# Patient Record
Sex: Male | Born: 1965 | Race: White | Hispanic: No | Marital: Married | Smoking: Never smoker
Health system: Southern US, Community
[De-identification: ages and names within clinical notes are randomized; demographics above are authoritative.]

## PROBLEM LIST (undated history)

## (undated) DIAGNOSIS — K219 Gastro-esophageal reflux disease without esophagitis: Secondary | ICD-10-CM

## (undated) DIAGNOSIS — D229 Melanocytic nevi, unspecified: Secondary | ICD-10-CM

## (undated) DIAGNOSIS — N201 Calculus of ureter: Secondary | ICD-10-CM

## (undated) DIAGNOSIS — T7840XA Allergy, unspecified, initial encounter: Secondary | ICD-10-CM

## (undated) HISTORY — PX: WISDOM TOOTH EXTRACTION: SHX21

## (undated) HISTORY — PX: PATELLAR TENDON REPAIR: SHX737

## (undated) HISTORY — DX: Allergy, unspecified, initial encounter: T78.40XA

## (undated) HISTORY — PX: LITHOTRIPSY: SUR834

## (undated) HISTORY — DX: Gastro-esophageal reflux disease without esophagitis: K21.9

---

## 1988-11-22 DIAGNOSIS — D229 Melanocytic nevi, unspecified: Secondary | ICD-10-CM

## 1988-11-22 HISTORY — DX: Melanocytic nevi, unspecified: D22.9

## 2006-06-26 ENCOUNTER — Emergency Department: Payer: Self-pay | Admitting: Emergency Medicine

## 2006-06-29 ENCOUNTER — Ambulatory Visit: Payer: Self-pay | Admitting: Gerontology

## 2009-09-26 ENCOUNTER — Ambulatory Visit: Payer: Self-pay

## 2010-07-24 ENCOUNTER — Emergency Department: Payer: Self-pay | Admitting: Emergency Medicine

## 2011-02-19 ENCOUNTER — Ambulatory Visit: Payer: Self-pay | Admitting: Urology

## 2011-10-07 ENCOUNTER — Ambulatory Visit: Payer: Self-pay | Admitting: Unknown Physician Specialty

## 2012-02-15 ENCOUNTER — Ambulatory Visit: Payer: Self-pay | Admitting: Urology

## 2012-09-08 ENCOUNTER — Emergency Department: Payer: Self-pay | Admitting: Emergency Medicine

## 2012-09-08 LAB — BASIC METABOLIC PANEL
Anion Gap: 9 (ref 7–16)
Calcium, Total: 8.8 mg/dL (ref 8.5–10.1)
Co2: 28 mmol/L (ref 21–32)
EGFR (African American): >60
EGFR (Non-African Amer.): >60
Sodium: 143 mmol/L (ref 136–145)

## 2012-09-08 LAB — URINALYSIS, COMPLETE
Ketone: NEGATIVE
Leukocyte Esterase: NEGATIVE
Nitrite: NEGATIVE
Protein: NEGATIVE
RBC,UR: 52 /HPF (ref 0–5)
WBC UR: 1 /HPF (ref 0–5)

## 2012-09-08 LAB — CBC
MCH: 31.2 pg (ref 26.0–34.0)
MCHC: 34.8 g/dL (ref 32.0–36.0)
Platelet: 265 10*3/uL (ref 150–440)

## 2012-09-08 LAB — LIPASE, BLOOD: Lipase: 132 U/L (ref 73–393)

## 2012-09-18 ENCOUNTER — Emergency Department: Payer: Self-pay | Admitting: Emergency Medicine

## 2012-09-18 LAB — COMPREHENSIVE METABOLIC PANEL
Albumin: 3.9 g/dL (ref 3.4–5.0)
Alkaline Phosphatase: 72 U/L (ref 50–136)
Anion Gap: 8 (ref 7–16)
BUN: 21 mg/dL — ABNORMAL HIGH (ref 7–18)
Calcium, Total: 8.5 mg/dL (ref 8.5–10.1)
Chloride: 107 mmol/L (ref 98–107)
Creatinine: 1.56 mg/dL — ABNORMAL HIGH (ref 0.60–1.30)
Glucose: 94 mg/dL (ref 65–99)
Osmolality: 286 (ref 275–301)
Potassium: 4.2 mmol/L (ref 3.5–5.1)
Total Protein: 7.7 g/dL (ref 6.4–8.2)

## 2012-09-18 LAB — URINALYSIS, COMPLETE
Ketone: NEGATIVE
Ph: 5 (ref 4.5–8.0)
Protein: NEGATIVE
RBC,UR: 3 /HPF (ref 0–5)
Specific Gravity: 1.018 (ref 1.003–1.030)
Squamous Epithelial: <1

## 2012-09-18 LAB — CBC
HCT: 44 % (ref 40.0–52.0)
MCH: 30.9 pg (ref 26.0–34.0)
MCHC: 34.4 g/dL (ref 32.0–36.0)
MCV: 90 fL (ref 80–100)
Platelet: 235 10*3/uL (ref 150–440)
RDW: 12.3 % (ref 11.5–14.5)

## 2012-09-18 LAB — LIPASE, BLOOD: Lipase: 142 U/L (ref 73–393)

## 2012-09-20 DIAGNOSIS — N23 Unspecified renal colic: Secondary | ICD-10-CM | POA: Insufficient documentation

## 2013-06-18 ENCOUNTER — Ambulatory Visit: Payer: Self-pay | Admitting: Unknown Physician Specialty

## 2013-06-20 ENCOUNTER — Ambulatory Visit: Payer: Self-pay | Admitting: Orthopedic Surgery

## 2013-06-20 LAB — BASIC METABOLIC PANEL
Calcium, Total: 8.9 mg/dL (ref 8.5–10.1)
Chloride: 104 mmol/L (ref 98–107)
Creatinine: 1.19 mg/dL (ref 0.60–1.30)
EGFR (Non-African Amer.): >60
Glucose: 94 mg/dL (ref 65–99)
Osmolality: 279 (ref 275–301)
Sodium: 138 mmol/L (ref 136–145)

## 2013-06-20 LAB — PROTIME-INR
INR: 1
Prothrombin Time: 13.3 secs (ref 11.5–14.7)

## 2013-06-20 LAB — URINALYSIS, COMPLETE
Bilirubin,UR: NEGATIVE
Blood: NEGATIVE
Ketone: NEGATIVE
Leukocyte Esterase: NEGATIVE
Nitrite: NEGATIVE
Ph: 5 (ref 4.5–8.0)
RBC,UR: 1 /HPF (ref 0–5)
Specific Gravity: 1.021 (ref 1.003–1.030)
Squamous Epithelial: NONE SEEN

## 2013-06-20 LAB — CBC
HCT: 41.2 % (ref 40.0–52.0)
MCH: 30.8 pg (ref 26.0–34.0)
MCHC: 35.3 g/dL (ref 32.0–36.0)
MCV: 87 fL (ref 80–100)
Platelet: 248 10*3/uL (ref 150–440)
RBC: 4.72 10*6/uL (ref 4.40–5.90)
RDW: 12.2 % (ref 11.5–14.5)
WBC: 5.2 10*3/uL (ref 3.8–10.6)

## 2013-06-21 ENCOUNTER — Ambulatory Visit: Payer: Self-pay | Admitting: Orthopedic Surgery

## 2013-09-01 ENCOUNTER — Emergency Department: Payer: Self-pay | Admitting: Emergency Medicine

## 2013-09-01 LAB — COMPREHENSIVE METABOLIC PANEL
Anion Gap: 6 — ABNORMAL LOW (ref 7–16)
Chloride: 105 mmol/L (ref 98–107)
Co2: 26 mmol/L (ref 21–32)
Creatinine: 1.16 mg/dL (ref 0.60–1.30)
EGFR (African American): >60
Glucose: 123 mg/dL — ABNORMAL HIGH (ref 65–99)
Osmolality: 275 (ref 275–301)
SGOT(AST): 29 U/L (ref 15–37)
Sodium: 137 mmol/L (ref 136–145)
Total Protein: 7.3 g/dL (ref 6.4–8.2)

## 2013-09-01 LAB — CBC
HCT: 42.5 % (ref 40.0–52.0)
MCH: 30.5 pg (ref 26.0–34.0)
MCV: 87 fL (ref 80–100)
Platelet: 250 10*3/uL (ref 150–440)
RBC: 4.88 10*6/uL (ref 4.40–5.90)
RDW: 12.5 % (ref 11.5–14.5)
WBC: 10.6 10*3/uL (ref 3.8–10.6)

## 2013-09-01 LAB — URINALYSIS, COMPLETE
Bacteria: NONE SEEN
Ketone: NEGATIVE
Leukocyte Esterase: NEGATIVE
Nitrite: NEGATIVE
Protein: NEGATIVE
Specific Gravity: 1.011 (ref 1.003–1.030)
WBC UR: 1 /HPF (ref 0–5)

## 2013-11-22 HISTORY — PX: KNEE ARTHROSCOPY: SUR90

## 2014-01-18 ENCOUNTER — Emergency Department: Payer: Self-pay | Admitting: Emergency Medicine

## 2014-01-18 LAB — CBC
HCT: 43.8 % (ref 40.0–52.0)
HGB: 14.8 g/dL (ref 13.0–18.0)
MCH: 30.2 pg (ref 26.0–34.0)
MCHC: 33.8 g/dL (ref 32.0–36.0)
MCV: 89 fL (ref 80–100)
Platelet: 265 10*3/uL (ref 150–440)
RBC: 4.91 10*6/uL (ref 4.40–5.90)
RDW: 12.5 % (ref 11.5–14.5)
WBC: 6.7 10*3/uL (ref 3.8–10.6)

## 2014-01-18 LAB — URINALYSIS, COMPLETE
BACTERIA: NONE SEEN
Bilirubin,UR: NEGATIVE
Blood: NEGATIVE
Glucose,UR: NEGATIVE mg/dL (ref 0–75)
Ketone: NEGATIVE
Leukocyte Esterase: NEGATIVE
Nitrite: NEGATIVE
PH: 5 (ref 4.5–8.0)
PROTEIN: NEGATIVE
RBC,UR: 3 /HPF (ref 0–5)
SPECIFIC GRAVITY: 1.023 (ref 1.003–1.030)
WBC UR: <1 /HPF (ref 0–5)

## 2014-01-18 LAB — COMPREHENSIVE METABOLIC PANEL
ALK PHOS: 69 U/L
ALT: 41 U/L (ref 12–78)
ANION GAP: 10 (ref 7–16)
Albumin: 3.9 g/dL (ref 3.4–5.0)
BUN: 17 mg/dL (ref 7–18)
Bilirubin,Total: 0.6 mg/dL (ref 0.2–1.0)
CHLORIDE: 103 mmol/L (ref 98–107)
Calcium, Total: 8.9 mg/dL (ref 8.5–10.1)
Co2: 26 mmol/L (ref 21–32)
Creatinine: 1.31 mg/dL — ABNORMAL HIGH (ref 0.60–1.30)
GLUCOSE: 110 mg/dL — AB (ref 65–99)
Osmolality: 280 (ref 275–301)
POTASSIUM: 3.8 mmol/L (ref 3.5–5.1)
SGOT(AST): 17 U/L (ref 15–37)
SODIUM: 139 mmol/L (ref 136–145)
TOTAL PROTEIN: 7.6 g/dL (ref 6.4–8.2)

## 2014-01-20 LAB — URINE CULTURE

## 2014-12-31 IMAGING — US US RENAL KIDNEY
1 series · 14 of 25 positions shown · non-contrast
Comparison: CT abdomen pelvis dated 09/08/2012

CLINICAL DATA: Left lower quadrant pain, history of stones

EXAM:
RENAL/URINARY TRACT ULTRASOUND COMPLETE

[Series 1: us renal kidney · 0.23mm/px · 14 of 32 slices shown]
[im 1/32]
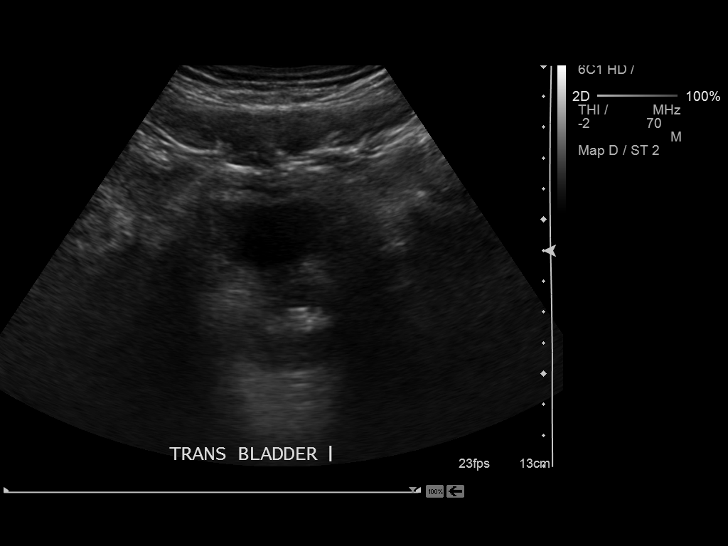
[im 3/32]
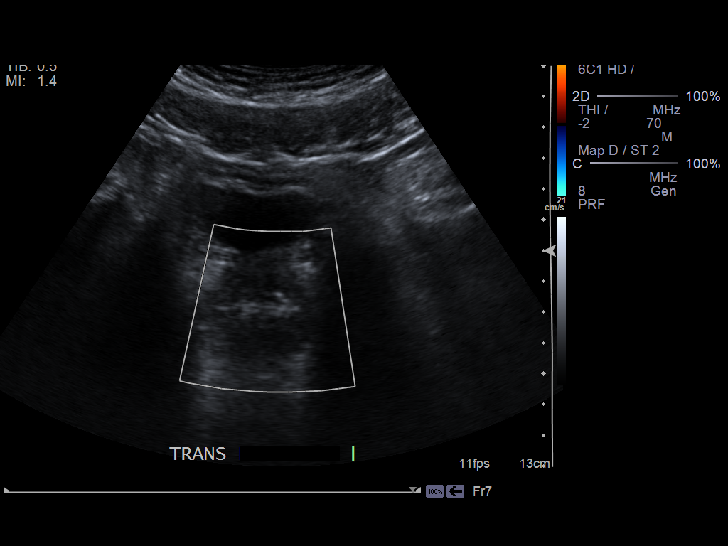
[im 6/32]
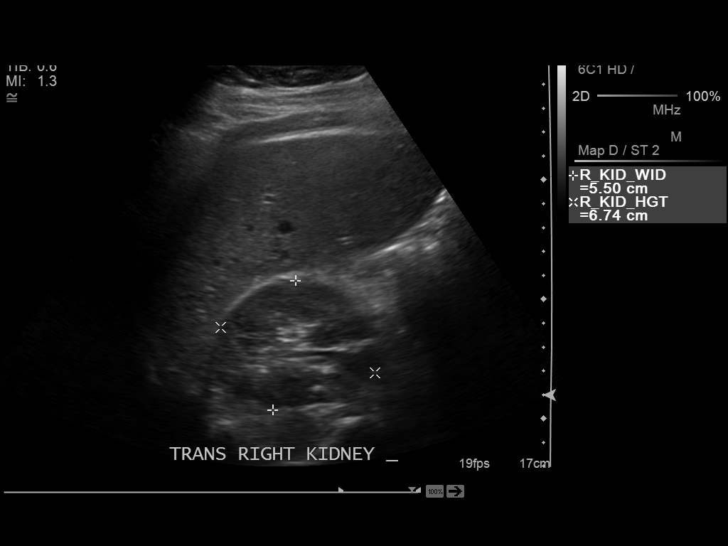
[im 8/32]
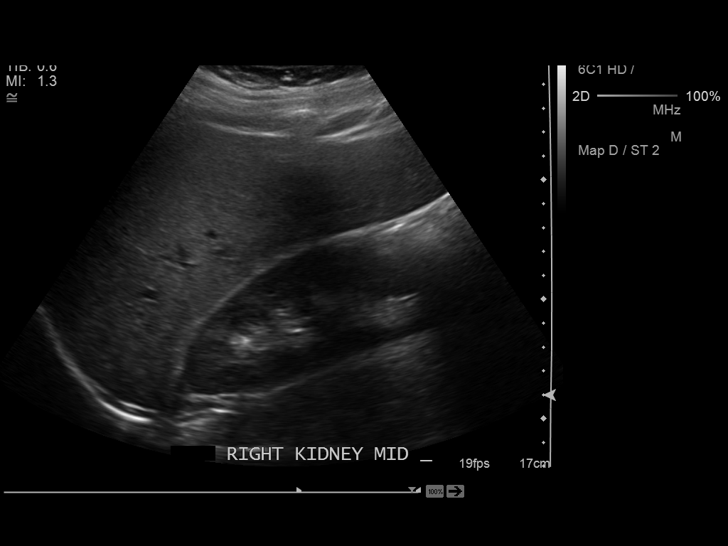
[im 11/32]
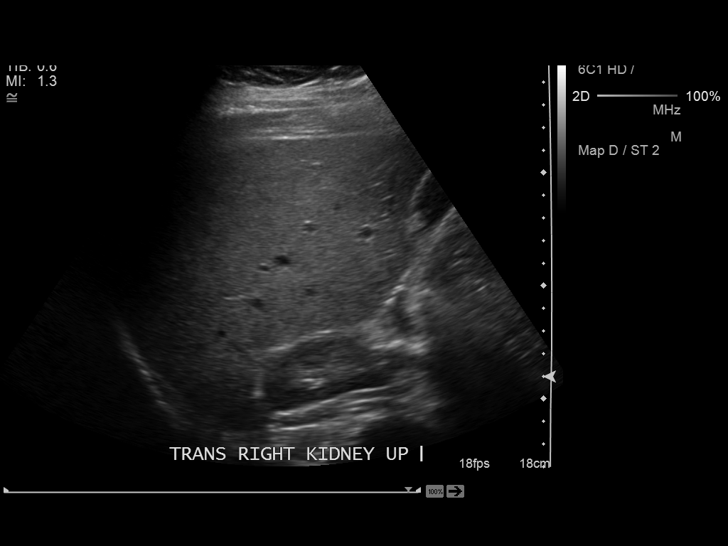
[im 12/32]
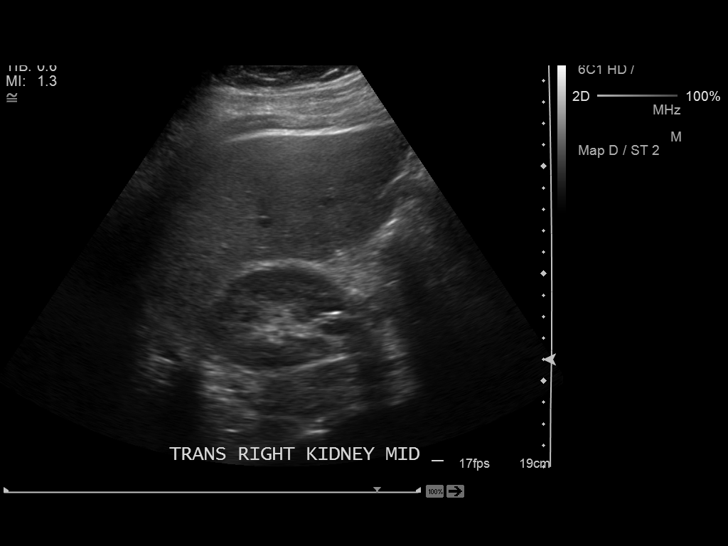
[im 15/32]
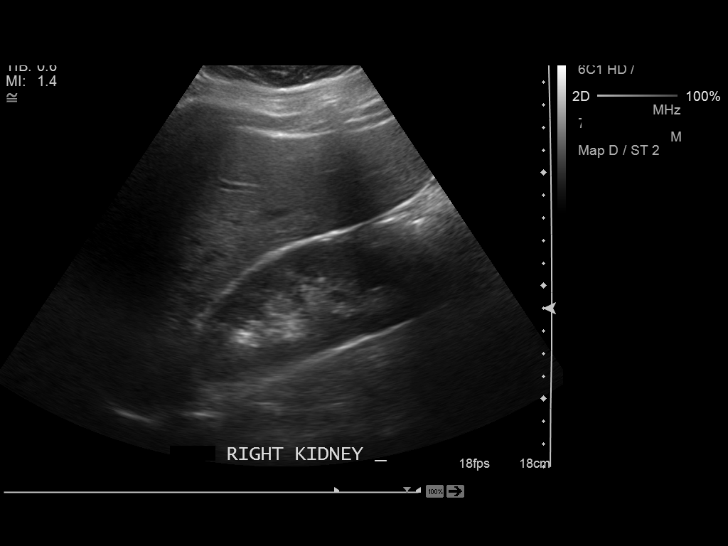
[im 17/32]
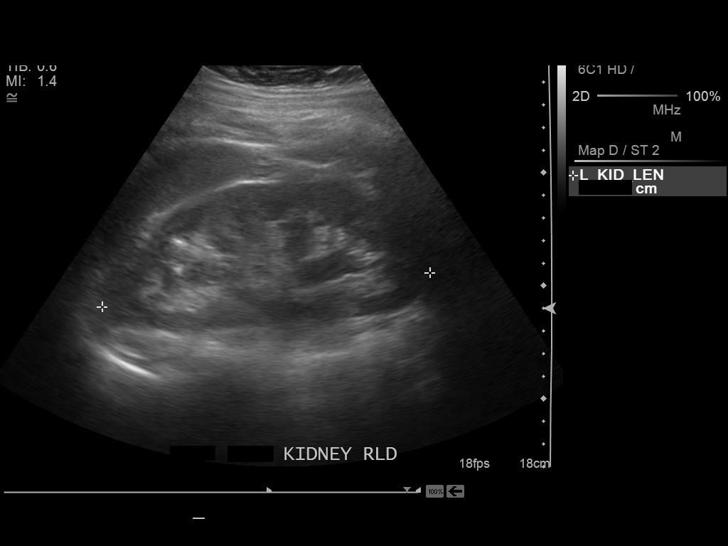
[im 20/32]
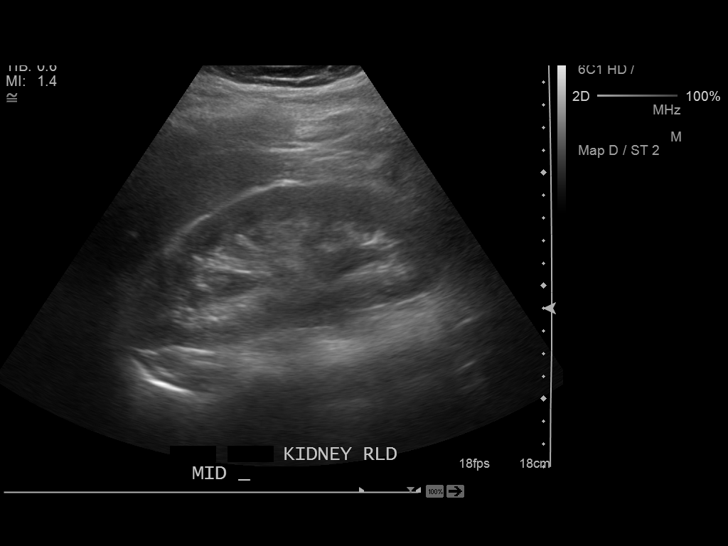
[im 21/32]
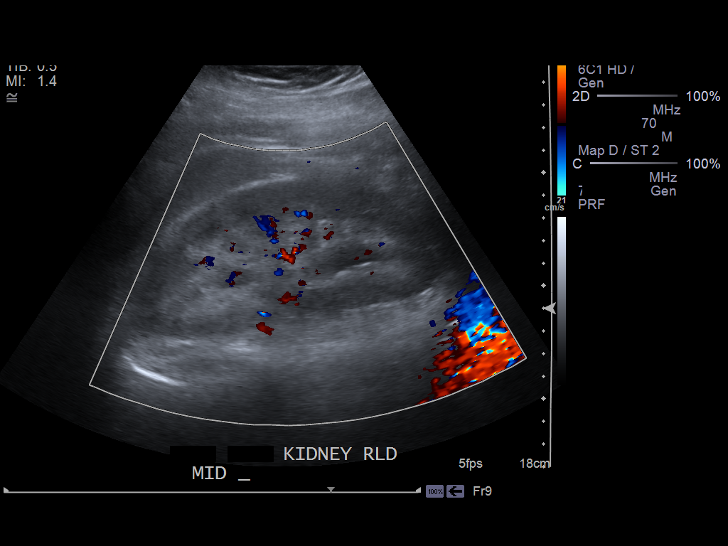
[im 24/32]
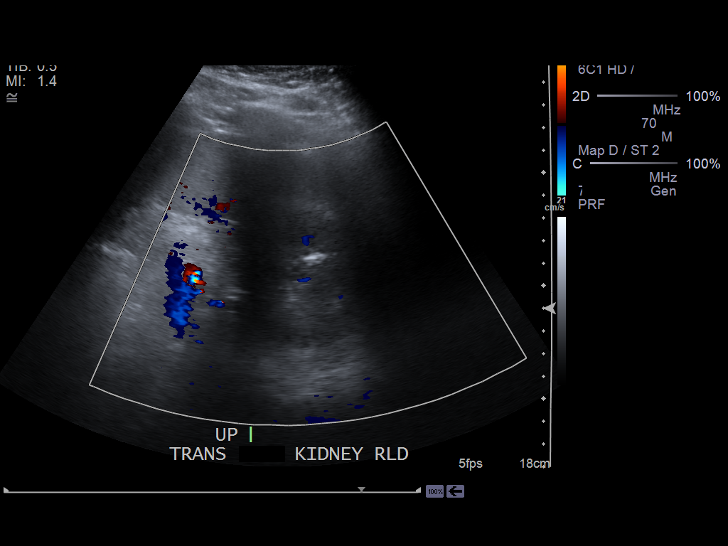
[im 26/32]
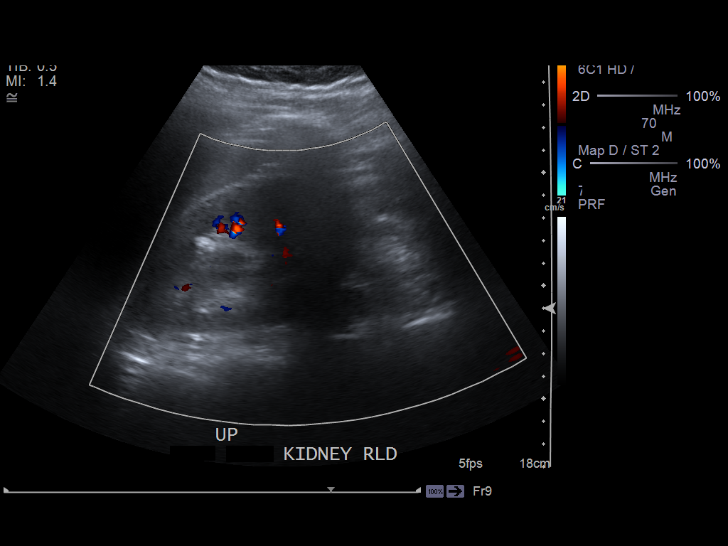
[im 29/32]
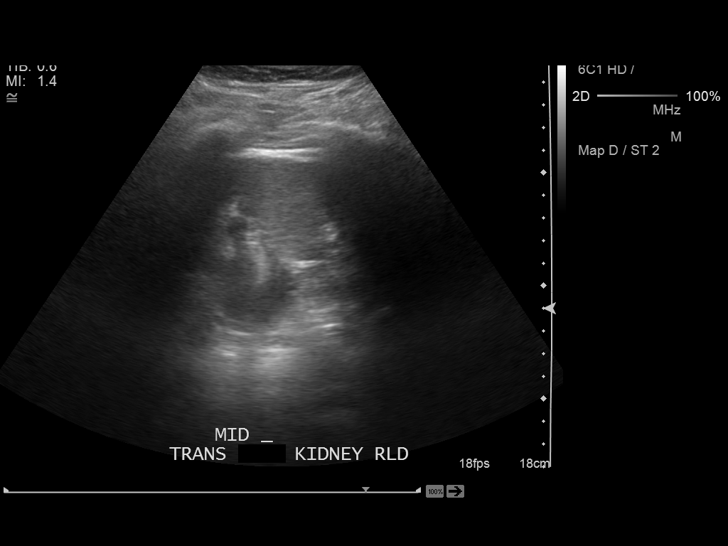
[im 32/32]
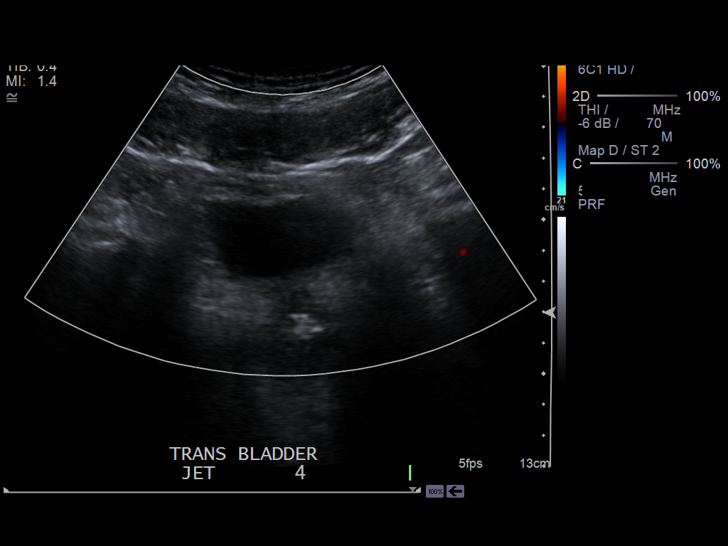

[14 of 25 positions shown; findings below may reference images not displayed]

FINDINGS: Right Kidney:

Length: 12.5 cm.  No mass or hydronephrosis.

Left Kidney:

Length: 14.6 cm. 10 mm probable left upper pole renal calculus. Mild
to moderate left hydronephrosis.

Bladder:

Underdistended.  Bilateral bladder jets were not visualized.
IMPRESSION: Mild to moderate left hydronephrosis. While an obstructing left
ureteral calculus is suspected, it is not visualized by ultrasound.

10 mm probable left upper pole renal calculus.

## 2014-12-31 IMAGING — CT CT STONE STUDY
3 of 4 series · 5 of 16 positions shown, 6 images · non-contrast
Comparison: 09/08/2012

CLINICAL DATA: Pelvic pain

EXAM:
CT ABDOMEN AND PELVIS WITHOUT CONTRAST
TECHNIQUE: Multidetector CT imaging of the abdomen and pelvis was performed
following the standard protocol without IV contrast.

[Series 4: lung · axial · 0.89mm/px · z∈[-588,-588]mm · 1 of 30 slices shown, 2 images]
[im 1/30  soft-tissue]
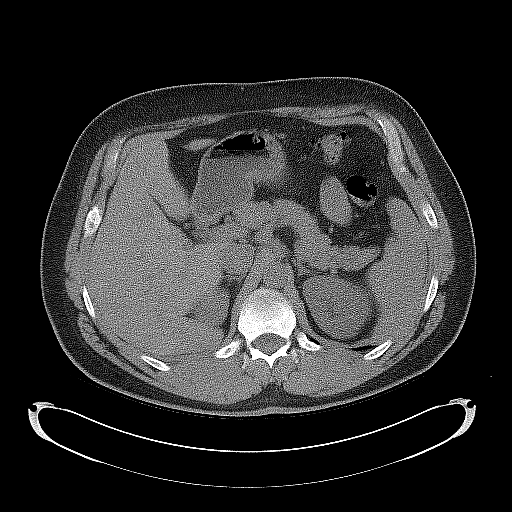
[im 1/30  bone]
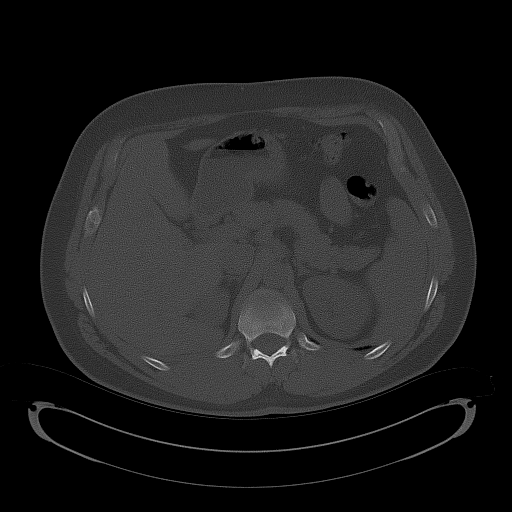

[Series 5: coronal · coronal · 0.94mm/px · 3 of 145 slices shown]
[im 37/145  soft-tissue]
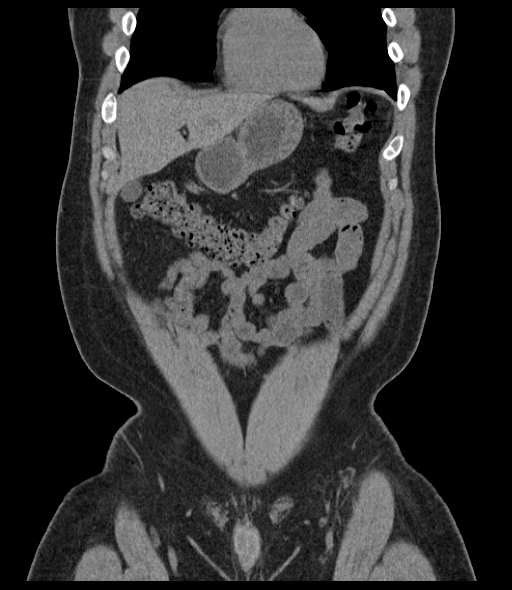
[im 73/145  soft-tissue]
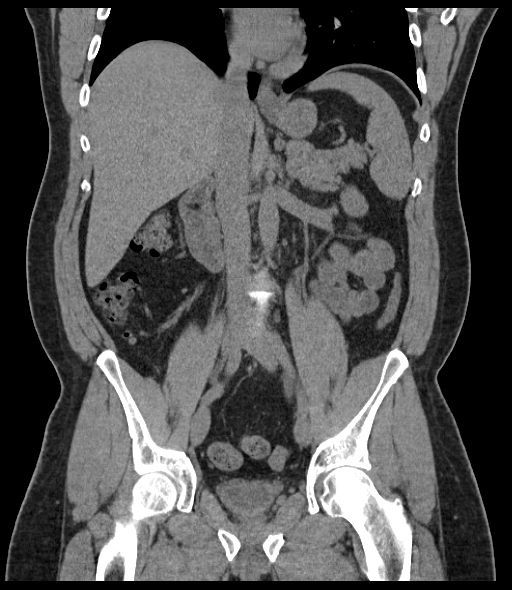
[im 109/145  soft-tissue]
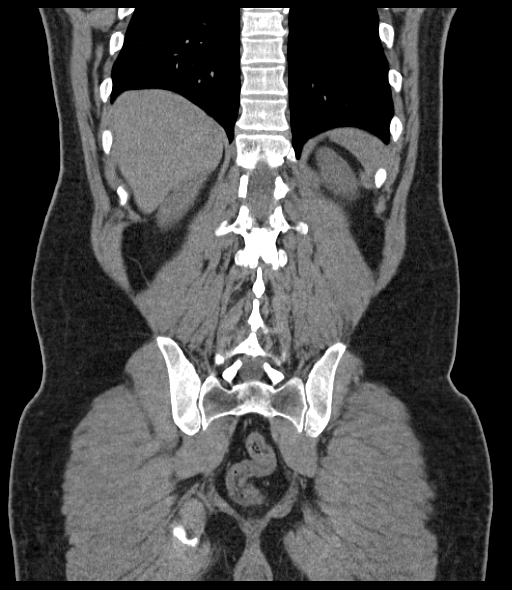

[Series 6: sagittal · sagittal · 0.66mm/px · 1 of 209 slices shown]
[im 105/209  soft-tissue]
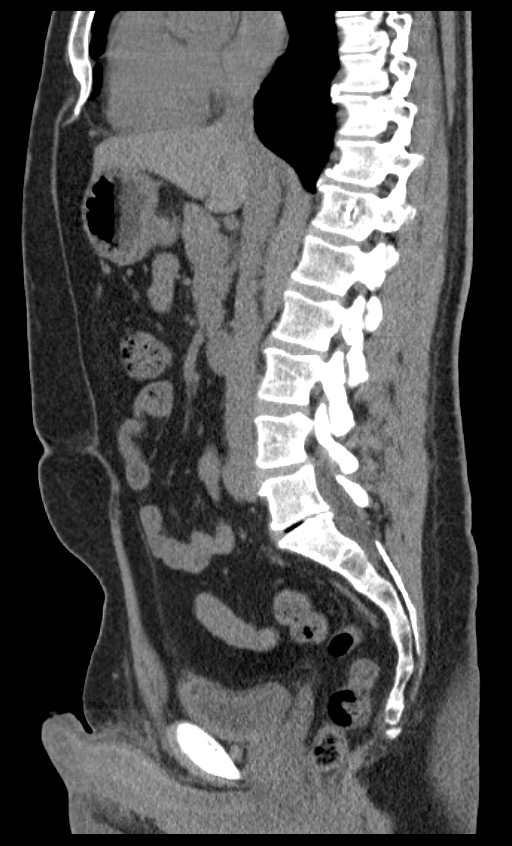

[5 of 16 positions shown; findings below may reference images not displayed]

FINDINGS: Visualized lung bases clear. Unremarkable liver, nondilated
gallbladder, spleen, adrenal glands, pancreas, abdominal aorta.
Unenhanced CT was performed per clinician order. Lack of IV contrast
limits sensitivity and specificity, especially for evaluation of
abdominal/pelvic solid viscera.

There is bilateral nephrolithiasis, largest stone or cluster on the
left in the upper pole 8 mm, on the right in the upper pole 5 mm.
There is left hydronephrosis and ureterectasis down to the level of
an 8 mm calculus, just proximal to the ureteral orifice. The urinary
bladder is nondistended. Bilateral small pelvic phleboliths. No
ascites. No free air. Stomach, small bowel, and colon are
nondilated. Normal appendix. A few scattered sigmoid diverticula
without adjacent inflammatory/edematous change. No adenopathy
evident. Degenerative disc disease L5-S1 .
IMPRESSION: 1. Obstructing 8 mm distal left ureteral calculus.
2. Bilateral nephrolithiasis.

## 2015-03-14 NOTE — Op Note (Signed)
PATIENT NAME:  Benjamin Wong, Benjamin Wong MR#:  [id number] DATE OF BIRTH:  Jul 03, 1966  DATE OF PROCEDURE:  06/21/2013  PREOPERATIVE DIAGNOSIS: Left knee patellar tendon rupture.   POSTOPERATIVE DIAGNOSIS: Left knee patellar tendon rupture.   PROCEDURE PERFORMED: Left knee patellar tendon repair.   SURGEON: Dawayne Patricia, MD   ASSISTANT: None.   ANESTHESIA: General.   TOURNIQUET TIME: 109 minutes at 300 mmHg.   ESTIMATED BLOOD LOSS: Less than 50 mL.  ESTIMATED FLUID: 1100 mL.  FINDINGS: Complete rupture of patellar tendon at the inferior pole of the patella.   DISPOSITION: The patient will be placed in a brace locked in extension. He will be given aspirin for prophylaxis. He will be given pain medication. He does have a Blake drain with a bulb. Drain will be discontinued by the patient's wife, who is a Marine scientist, on postoperative day 2.   INDICATIONS FOR PROCEDURE: Benjamin Wong is a 49 year old gentleman who works as a Engineer, structural in [place]. [place] beach [place] 1 [place] and sustained the aforementioned injury to his knee while playing volleyball. He [place] and was placed in a knee immobilizer. He presented for followup in our office on Monday, had an MRI the same day, and presented to my office on Wednesday. He was found to have a complete rupture of the patellar tendon. All the risks and benefits of surgical repair were discussed with the patient and his wife. The most common risks include stiffness of the knee postoperatively and anterior knee pain. The patient demonstrates good understanding and insight. He elected to undergo surgery.   DESCRIPTION OF PROCEDURE: Benjamin Wong was identified in the preoperative holding area. Left lower extremity was marked as the operative site. He was brought into the Operating Room and placed on the table in supine position. General anesthesia was administered.   Tourniquet was  applied to the left lower extremity. The left lower extremity was prepared and draped in the usual sterile fashion.  Preoperative timeout was performed identifying the patient, procedure, operative extremity, confirming preparation of skin, confirming consent form, confirming imaging studies and confirming administration of preoperative antibiotics.   The leg was elevated and tourniquet was inflated. The proximal pole of the patella was palpated and tibial tubercle was palpated. A longitudinal midline incision was made. Dissection was carried down through the skin and subcutaneous tissue. Significant clotted hematoma was expressed from the site of injury. The patella was very high-riding. The patella was readily drawn back down to the superior level of the notch with adequate muscle relaxation. The inferior pole of the patella was cleared of soft tissue, and a nice trough of bone was cleared. The tourniquet was deflated to ensure that this trough of bone did, in fact, bleed. When bleeding was confirmed, the tourniquet was reinflated.   The residual tendon was identified. Overlying retinaculum was preserved and remained intact through the procedure. The tendon edge was sharply debrided. Two #5 FiberWire sutures were passed using a running Krackow stitch down the lateral aspect of the tendon and back through the mid portion of the tendon. In a similar fashion, the second stitch was placed from the medial aspect of the tendon to the mid portion of the tendon. A 2.5 drill bit was used to make 3 holes in the patella. The middle hole was drilled first. A Hughston suture passer was passed through the middle hole, and the middle 2 sutures were retrieved  on the proximal aspect of the patella. In a similar fashion, the suture passer was passed through both the lateral and the medial holes, and the respective single suture limb was passed through. The 2 lateral sutures were tied with the knee held in slight flexion. A  knot was tightened down. In a similar fashion, medial 2 suture limbs were tied. At this time, the knee was taken through a range of motion. Adequate tension was maintained with the knee in full extension. The knee was readily able to be taken to 60 degrees of flexion with no stress placed across the repair.   Attention was now turned to the tibial tubercle. Using a 1.8 mm drill bit, a hole was drilled transversely across the tibial tubercle. A #2 Vicryl suture was passed through this hole. A Kelly clamp was used at the proximal aspect of the patella to reach across the patella behind the quadriceps tendon. When the clamp was passed, 1 limb of the suture was passed around the proximal pole of the patella. The #2 Vicryl suture was then tied at the proximal/lateral aspect of the patella. This was added absorbable fixation. At this time, the knee was copiously irrigated. Suture of 0 Vicryl was used to close the torn portion of the retinaculum. The medial portion of the joint was open from injury. Undersurface of the patella was palpated during drilling and maintained intact. The medial retinaculum was closed, sealing off the joint. A small Blake drain was placed outside of the patellar retinaculum. Deep tissue was closed using 0 Vicryl suture. The superficial tissue was closed using 2-0 Vicryl suture. Skin was closed using 1 suture. Sterile dressings were applied. The patient was placed in a hinged knee brace locked in extension. TENS leads and a Polar Care unit was applied. The patient will be nonweightbearing. He will be maintained in extension until he begins physical therapy. When he begins physical therapy, they will start range of motion at 0 to 40 degrees of flexion and progress accordingly.    ____________________________ Dawayne Patricia, MD sr:cb D: 06/21/2013 15:56:21 ET T: 06/21/2013 20:35:37 ET JOB#: [id number]  cc: Dawayne Patricia, MD, <Dictator> Dawayne Patricia MD ELECTRONICALLY SIGNED  07/17/2013 10:21

## 2017-03-02 DIAGNOSIS — L729 Follicular cyst of the skin and subcutaneous tissue, unspecified: Secondary | ICD-10-CM | POA: Diagnosis not present

## 2017-03-02 DIAGNOSIS — G8929 Other chronic pain: Secondary | ICD-10-CM | POA: Insufficient documentation

## 2017-03-02 DIAGNOSIS — E7800 Pure hypercholesterolemia, unspecified: Secondary | ICD-10-CM | POA: Diagnosis not present

## 2017-03-02 DIAGNOSIS — E669 Obesity, unspecified: Secondary | ICD-10-CM | POA: Insufficient documentation

## 2017-03-03 DIAGNOSIS — E7800 Pure hypercholesterolemia, unspecified: Secondary | ICD-10-CM | POA: Diagnosis not present

## 2017-03-03 DIAGNOSIS — E781 Pure hyperglyceridemia: Secondary | ICD-10-CM | POA: Insufficient documentation

## 2017-06-28 DIAGNOSIS — E781 Pure hyperglyceridemia: Secondary | ICD-10-CM | POA: Diagnosis not present

## 2017-07-05 DIAGNOSIS — R1310 Dysphagia, unspecified: Secondary | ICD-10-CM | POA: Diagnosis not present

## 2017-07-05 DIAGNOSIS — Z0000 Encounter for general adult medical examination without abnormal findings: Secondary | ICD-10-CM | POA: Diagnosis not present

## 2017-07-21 DIAGNOSIS — D229 Melanocytic nevi, unspecified: Secondary | ICD-10-CM | POA: Diagnosis not present

## 2017-07-21 DIAGNOSIS — L508 Other urticaria: Secondary | ICD-10-CM | POA: Diagnosis not present

## 2017-07-21 DIAGNOSIS — L509 Urticaria, unspecified: Secondary | ICD-10-CM | POA: Diagnosis not present

## 2017-07-21 DIAGNOSIS — L821 Other seborrheic keratosis: Secondary | ICD-10-CM | POA: Diagnosis not present

## 2017-07-21 DIAGNOSIS — Z79899 Other long term (current) drug therapy: Secondary | ICD-10-CM | POA: Diagnosis not present

## 2017-09-01 DIAGNOSIS — R21 Rash and other nonspecific skin eruption: Secondary | ICD-10-CM | POA: Diagnosis not present

## 2017-09-01 DIAGNOSIS — B372 Candidiasis of skin and nail: Secondary | ICD-10-CM | POA: Diagnosis not present

## 2017-09-07 DIAGNOSIS — T7840XA Allergy, unspecified, initial encounter: Secondary | ICD-10-CM | POA: Diagnosis not present

## 2017-09-07 DIAGNOSIS — Z79899 Other long term (current) drug therapy: Secondary | ICD-10-CM | POA: Diagnosis not present

## 2017-09-22 HISTORY — PX: COLONOSCOPY WITH ESOPHAGOGASTRODUODENOSCOPY (EGD): SHX5779

## 2017-09-29 DIAGNOSIS — L739 Follicular disorder, unspecified: Secondary | ICD-10-CM | POA: Diagnosis not present

## 2017-09-29 DIAGNOSIS — L509 Urticaria, unspecified: Secondary | ICD-10-CM | POA: Diagnosis not present

## 2017-09-29 DIAGNOSIS — R21 Rash and other nonspecific skin eruption: Secondary | ICD-10-CM | POA: Diagnosis not present

## 2017-09-30 DIAGNOSIS — Z8371 Family history of colonic polyps: Secondary | ICD-10-CM | POA: Diagnosis not present

## 2017-09-30 DIAGNOSIS — R1310 Dysphagia, unspecified: Secondary | ICD-10-CM | POA: Diagnosis not present

## 2017-10-06 DIAGNOSIS — K210 Gastro-esophageal reflux disease with esophagitis: Secondary | ICD-10-CM | POA: Diagnosis not present

## 2017-10-06 DIAGNOSIS — K2960 Other gastritis without bleeding: Secondary | ICD-10-CM | POA: Diagnosis not present

## 2017-10-06 DIAGNOSIS — Z8371 Family history of colonic polyps: Secondary | ICD-10-CM | POA: Diagnosis not present

## 2017-10-06 DIAGNOSIS — Z1211 Encounter for screening for malignant neoplasm of colon: Secondary | ICD-10-CM | POA: Diagnosis not present

## 2017-10-06 DIAGNOSIS — R1310 Dysphagia, unspecified: Secondary | ICD-10-CM | POA: Diagnosis not present

## 2017-10-06 DIAGNOSIS — K2950 Unspecified chronic gastritis without bleeding: Secondary | ICD-10-CM | POA: Diagnosis not present

## 2017-10-06 DIAGNOSIS — K640 First degree hemorrhoids: Secondary | ICD-10-CM | POA: Diagnosis not present

## 2017-10-06 DIAGNOSIS — K2990 Gastroduodenitis, unspecified, without bleeding: Secondary | ICD-10-CM | POA: Diagnosis not present

## 2017-10-27 DIAGNOSIS — R21 Rash and other nonspecific skin eruption: Secondary | ICD-10-CM | POA: Diagnosis not present

## 2017-10-27 DIAGNOSIS — L739 Follicular disorder, unspecified: Secondary | ICD-10-CM | POA: Diagnosis not present

## 2018-01-20 DIAGNOSIS — N201 Calculus of ureter: Secondary | ICD-10-CM

## 2018-01-20 HISTORY — DX: Calculus of ureter: N20.1

## 2018-01-23 ENCOUNTER — Emergency Department: Payer: 59

## 2018-01-23 ENCOUNTER — Emergency Department
Admission: EM | Admit: 2018-01-23 | Discharge: 2018-01-23 | Disposition: A | Discharge status: Home / Self Care | Payer: 59 | Attending: Emergency Medicine | Admitting: Emergency Medicine

## 2018-01-23 ENCOUNTER — Other Ambulatory Visit: Payer: Self-pay

## 2018-01-23 DIAGNOSIS — N23 Unspecified renal colic: Secondary | ICD-10-CM | POA: Insufficient documentation

## 2018-01-23 DIAGNOSIS — N200 Calculus of kidney: Secondary | ICD-10-CM | POA: Diagnosis not present

## 2018-01-23 DIAGNOSIS — R1032 Left lower quadrant pain: Secondary | ICD-10-CM | POA: Diagnosis not present

## 2018-01-23 DIAGNOSIS — R110 Nausea: Secondary | ICD-10-CM | POA: Diagnosis not present

## 2018-01-23 LAB — URINALYSIS, COMPLETE (UACMP) WITH MICROSCOPIC
BACTERIA UA: NONE SEEN
Bilirubin Urine: NEGATIVE
GLUCOSE, UA: NEGATIVE mg/dL
Ketones, ur: NEGATIVE mg/dL
Leukocytes, UA: NEGATIVE
NITRITE: NEGATIVE
PROTEIN: NEGATIVE mg/dL
Specific Gravity, Urine: 1.017 (ref 1.005–1.030)
Squamous Epithelial / LPF: NONE SEEN
WBC UA: NONE SEEN WBC/hpf (ref 0–5)
pH: 5 (ref 5.0–8.0)

## 2018-01-23 LAB — COMPREHENSIVE METABOLIC PANEL
ALBUMIN: 4.4 g/dL (ref 3.5–5.0)
ALK PHOS: 55 U/L (ref 38–126)
ALT: 25 U/L (ref 17–63)
AST: 20 U/L (ref 15–41)
Anion gap: 10 (ref 5–15)
BUN: 26 mg/dL — ABNORMAL HIGH (ref 6–20)
CALCIUM: 9.1 mg/dL (ref 8.9–10.3)
CO2: 23 mmol/L (ref 22–32)
Chloride: 106 mmol/L (ref 101–111)
Creatinine, Ser: 1.22 mg/dL (ref 0.61–1.24)
GFR calc non Af Amer: >60 mL/min (ref >60)
GLUCOSE: 123 mg/dL — AB (ref 65–99)
POTASSIUM: 4.1 mmol/L (ref 3.5–5.1)
SODIUM: 139 mmol/L (ref 135–145)
TOTAL PROTEIN: 7.4 g/dL (ref 6.5–8.1)
Total Bilirubin: 0.9 mg/dL (ref 0.3–1.2)

## 2018-01-23 LAB — CBC
HEMATOCRIT: 46.4 % (ref 40.0–52.0)
HEMOGLOBIN: 15.6 g/dL (ref 13.0–18.0)
MCH: 30.3 pg (ref 26.0–34.0)
MCHC: 33.7 g/dL (ref 32.0–36.0)
MCV: 89.9 fL (ref 80.0–100.0)
Platelets: 236 10*3/uL (ref 150–440)
RBC: 5.16 MIL/uL (ref 4.40–5.90)
RDW: 12.3 % (ref 11.5–14.5)
WBC: 7.4 10*3/uL (ref 3.8–10.6)

## 2018-01-23 MED ORDER — ONDANSETRON 4 MG PO TBDP: 4.0000 mg | ORAL_TABLET | Freq: Three times a day (TID) | ORAL | 0 refills | Status: DC | PRN

## 2018-01-23 MED ORDER — TAMSULOSIN HCL 0.4 MG PO CAPS: 0.4000 mg | ORAL_CAPSULE | Freq: Every day | ORAL | 0 refills | Status: DC

## 2018-01-23 MED ORDER — KETOROLAC TROMETHAMINE 30 MG/ML IJ SOLN
15.0000 mg | Freq: Once | INTRAMUSCULAR | Status: AC
  Administered 2018-01-23: 15 mg via INTRAVENOUS
  Filled 2018-01-23: qty 1

## 2018-01-23 MED ORDER — OXYCODONE-ACETAMINOPHEN 7.5-325 MG PO TABS
1.0000 | ORAL_TABLET | Freq: Three times a day (TID) | ORAL | 0 refills | Status: AC | PRN
Start: 2018-01-23 — End: 2019-01-23

## 2018-01-23 MED ORDER — HYDROMORPHONE HCL 1 MG/ML IJ SOLN: 0.5000 mg | Freq: Once | INTRAMUSCULAR | Status: DC

## 2018-01-23 MED ORDER — MORPHINE SULFATE (PF) 4 MG/ML IV SOLN
4.0000 mg | Freq: Once | INTRAVENOUS | Status: AC
  Administered 2018-01-23: 4 mg via INTRAVENOUS
  Filled 2018-01-23: qty 1

## 2018-01-23 MED ORDER — ONDANSETRON HCL 4 MG/2ML IJ SOLN
4.0000 mg | Freq: Once | INTRAMUSCULAR | Status: AC
  Administered 2018-01-23: 4 mg via INTRAVENOUS
  Filled 2018-01-23: qty 2

## 2018-01-23 MED ORDER — ONDANSETRON HCL 4 MG/2ML IJ SOLN
4.0000 mg | Freq: Once | INTRAMUSCULAR | Status: AC | PRN
  Administered 2018-01-23: 4 mg via INTRAVENOUS
  Filled 2018-01-23: qty 2

## 2018-01-23 NOTE — ED Triage Notes (Signed)
Pt states onset of llq pain with nausea that woke him from sleep at 0130. Pt denies known hematuria. Pt states history of renal calculi "that felt this way". Pt denies flank pain.

## 2018-01-23 NOTE — ED Provider Notes (Signed)
[facility] Emergency Department Provider Note       Time seen: ----------------------------------------- 7:17 AM on 01/23/2018 -----------------------------------------   I have reviewed the triage vital signs and the nursing notes.  HISTORY   Chief Complaint Abdominal Pain    HPI Benjamin Wong is a 52 y.o. male with a history of kidney stones who presents to the ED for left lower quadrant pain with nausea that woke him from sleep at 130 this morning.  Patient denies any hematuria.  He has a history of kidney stones that feel this way.  He has had to have lithotripsy in the distant past.  Pain is only in the left lower quadrant, 8 out of 10. No past medical history on file.  There are no active problems to display for this patient.   Allergies Patient has no known allergies.  Social History Social History   Tobacco Use  . Smoking status: Not on file  Substance Use Topics  . Alcohol use: Not on file  . Drug use: Not on file    Review of Systems Constitutional: Negative for fever. Cardiovascular: Negative for chest pain. Respiratory: Negative for shortness of breath. Gastrointestinal: Positive for left lower quadrant pain, nausea Musculoskeletal: Negative for back pain. Skin: Negative for rash. Neurological: Negative for headaches, focal weakness or numbness.  All systems negative/normal/unremarkable except as stated in the HPI  ____________________________________________   PHYSICAL EXAM:  VITAL SIGNS: ED Triage Vitals [01/23/18 0546]  Enc Vitals Group     BP 138/82     Pulse Rate 63     Resp 16     Temp 98.1 F (36.7 C)     Temp Source Oral     SpO2 100 %     Weight 256 lb (116.1 kg)     Height 6\' 3"  (1.905 m)     Head Circumference      Peak Flow      Pain Score 6     Pain Loc      Pain Edu?      Excl. in [place]?    Constitutional: Alert and oriented.  Mild distress Eyes: Conjunctivae are normal. Normal extraocular  movements. ENT   Head: Normocephalic and atraumatic.   Nose: No congestion/rhinnorhea.   Mouth/Throat: Mucous membranes are moist.   Neck: No stridor. Cardiovascular: Normal rate, regular rhythm. No murmurs, rubs, or gallops. Respiratory: Normal respiratory effort without tachypnea nor retractions. Breath sounds are clear and equal bilaterally. No wheezes/rales/rhonchi. Gastrointestinal: Soft with left lower quadrant tenderness.  Normal bowel sounds. Musculoskeletal: Nontender with normal range of motion in extremities. No lower extremity tenderness nor edema. Neurologic:  Normal speech and language. No gross focal neurologic deficits are appreciated.  Skin:  Skin is warm, dry and intact. No rash noted. Psychiatric: Mood and affect are normal. Speech and behavior are normal.  ____________________________________________  ED COURSE:  As part of my medical decision making, I reviewed the following data within the [facility] History obtained from family if available, nursing notes, old chart and ekg, as well as notes from prior ED visits. Patient presented for left lower quadrant pain, we will assess with labs and imaging as indicated at this time.   Procedures ____________________________________________   LABS (pertinent positives/negatives)  Labs Reviewed  COMPREHENSIVE METABOLIC PANEL - Abnormal; Notable for the following components:      Result Value   Glucose, Bld 123 (*)    BUN 26 (*)    All other components  within normal limits  URINALYSIS, COMPLETE (UACMP) WITH MICROSCOPIC - Abnormal; Notable for the following components:   Color, Urine YELLOW (*)    APPearance HAZY (*)    Hgb urine dipstick LARGE (*)    All other components within normal limits  CBC    RADIOLOGY Images were viewed by me  CT renal protocol IMPRESSION: Mid left ureteral obstructing calculus with transverse dimension 4 x 5 mm spanning over 8 mm located 14.6 cm proximal to  left ureteral junction is causing moderate left-sided hydroureteronephrosis.  Multiple nonobstructing left renal calculi. Three right upper pole nonobstructing renal calculi.  Enlarged liver spanning over 23.5 cm.  Circumferential narrowing distal esophagus may be related to under distension or reflux. Limited for evaluating for mass.  No extraluminal bowel inflammatory process. Few scattered colonic diverticula.  Degenerative changes L4-5 and L5-S1. ____________________________________________  DIFFERENTIAL DIAGNOSIS   Renal colic, diverticulitis, pyelonephritis, UTI, musculoskeletal pain  FINAL ASSESSMENT AND PLAN  Renal colic   Plan: The patient had presented for left lower quadrant pain. Patient's labs are grossly unremarkable except for hematuria. Patient's imaging did reveal a proximal left ureteral stone.  It is unclear if he will be able to pass the stone or not.  He did improve in terms of pain with Toradol and morphine here.  He will be discharged with similar medications for home and outpatient urology follow-up.   Laurence Aly, MD   Note: This note was generated in part or whole with voice recognition software. Voice recognition is usually quite accurate but there are transcription errors that can and very often do occur. I apologize for any typographical errors that were not detected and corrected.     Earleen Newport, MD 01/23/18 [phone]

## 2018-01-23 NOTE — ED Notes (Signed)
Pt states he would like to hold pain medication now, but would like something for nausea.

## 2018-01-23 NOTE — ED Notes (Signed)
Patient transported to CT 

## 2018-01-23 NOTE — ED Notes (Signed)
Patient reports woke this morning around 1:30 am with left lower quad pain that was similar to previous kidney stone pain.

## 2018-01-24 ENCOUNTER — Encounter: Payer: Self-pay | Admitting: Urology

## 2018-01-24 ENCOUNTER — Encounter: Payer: Self-pay | Admitting: *Deleted

## 2018-01-24 ENCOUNTER — Other Ambulatory Visit: Payer: Self-pay | Admitting: Radiology

## 2018-01-24 ENCOUNTER — Ambulatory Visit (INDEPENDENT_AMBULATORY_CARE_PROVIDER_SITE_OTHER): Payer: 59 | Admitting: Urology

## 2018-01-24 VITALS — BP 139/87 | HR 104 | Ht 75.0 in | Wt 258.3 lb

## 2018-01-24 DIAGNOSIS — N23 Unspecified renal colic: Secondary | ICD-10-CM | POA: Diagnosis not present

## 2018-01-24 DIAGNOSIS — N201 Calculus of ureter: Secondary | ICD-10-CM

## 2018-01-24 DIAGNOSIS — N200 Calculus of kidney: Secondary | ICD-10-CM

## 2018-01-24 LAB — BLADDER SCAN AMB NON-IMAGING: SCAN RESULT: 56

## 2018-01-24 LAB — URINALYSIS, COMPLETE
BILIRUBIN UA: NEGATIVE
GLUCOSE, UA: NEGATIVE
Ketones, UA: NEGATIVE
LEUKOCYTES UA: NEGATIVE
Nitrite, UA: NEGATIVE
PH UA: 5.5 (ref 5.0–7.5)
PROTEIN UA: NEGATIVE
Specific Gravity, UA: 1.01 (ref 1.005–1.030)
Urobilinogen, Ur: 0.2 mg/dL (ref 0.2–1.0)

## 2018-01-24 NOTE — Progress Notes (Signed)
In office she is very at  [facility]  01/24/2018 2:12 PM   [name] [birth year] [id number]  Referring provider: Dion Body, MD [facility] [place] [address]  Chief Complaint  Patient presents with  . Nephrolithiasis    HPI: Benjamin Wong is a 52 year old male who presents for evaluation of renal colic.  He has a long history of recurrent stone disease and presented to the ED on 01/23/2018 with a 6-hour history of left lower quadrant abdominal pain.  There were no identifiable precipitating, aggravating or alleviating factors.  Severity was rated 8/10.  He had nausea without vomiting.  Denies fever or chills.  A stone protocol CT was performed which showed a 4 x 5 x 8 mm left proximal ureteral calculus with moderate hydronephrosis/hydroureter.  He also had bilateral, nonobstructing renal calculi.  He has had prior shockwave lithotripsy in the mid 1990s and a previous stent placement.  His pain is presently controlled on Percocet.   PMH: Past Medical History:  Diagnosis Date  . Allergy   . GERD (gastroesophageal reflux disease)     Surgical History: Past Surgical History:  Procedure Laterality Date  . COLONOSCOPY WITH ESOPHAGOGASTRODUODENOSCOPY (EGD)    . KNEE ARTHROSCOPY    . LITHOTRIPSY    . WISDOM TOOTH EXTRACTION      Home Medications:  Allergies as of 01/24/2018      Reactions   Prednisone Hives   Morphine And Related Itching   Other    Other reaction(s): Other (See Comments), Other (See Comments) Apoxia resin & rubber- itching and hands will itch & swelling Apoxia resin & rubber- itching and hands will itch & swelling Apoxia resin & rubber- itching and hands will itch & swelling   Omeprazole Itching      Medication List        Accurate as of 01/24/18  2:12 PM. Always use your most recent med list.          cetirizine 10 MG tablet Commonly known as:  ZYRTEC Take 1 tablet by mouth daily.   omeprazole 40  MG capsule Commonly known as:  PRILOSEC Take 1 capsule by mouth daily.   ondansetron 4 MG disintegrating tablet Commonly known as:  ZOFRAN ODT Take 1 tablet (4 mg total) by mouth every 8 (eight) hours as needed for nausea or vomiting.   oxyCODONE-acetaminophen 7.5-325 MG tablet Commonly known as:  PERCOCET Take 1 tablet by mouth every 8 (eight) hours as needed for severe pain.   ranitidine 150 MG capsule Commonly known as:  ZANTAC Take by mouth.   tamsulosin 0.4 MG Caps capsule Commonly known as:  FLOMAX Take 1 capsule (0.4 mg total) by mouth daily after breakfast.       Allergies:  Allergies  Allergen Reactions  . Prednisone Hives  . Morphine And Related Itching  . Other     Other reaction(s): Other (See Comments), Other (See Comments) Apoxia resin & rubber- itching and hands will itch & swelling Apoxia resin & rubber- itching and hands will itch & swelling Apoxia resin & rubber- itching and hands will itch & swelling   . Omeprazole Itching    Family History: Family History  Problem Relation Age of Onset  . Prostate cancer Neg Hx   . Bladder Cancer Neg Hx   . Kidney cancer Neg Hx     Social History:  reports that  has never smoked. He has quit using smokeless tobacco. His smokeless tobacco use included  snuff. He reports that he does not drink alcohol or use drugs.  ROS: UROLOGY Frequent Urination?: Yes Hard to postpone urination?: No Burning/pain with urination?: Yes Get up at night to urinate?: Yes Leakage of urine?: No Urine stream starts and stops?: Yes Trouble starting stream?: Yes Do you have to strain to urinate?: Yes Blood in urine?: Yes Urinary tract infection?: No Sexually transmitted disease?: No Injury to kidneys or bladder?: No Painful intercourse?: No Weak stream?: No Erection problems?: Yes Penile pain?: No  Gastrointestinal Nausea?: Yes Vomiting?: Yes Indigestion/heartburn?: No Diarrhea?: No Constipation?:  No  Constitutional Fever: No Night sweats?: Yes Weight loss?: No Fatigue?: No  Skin Skin rash/lesions?: No Itching?: No  Eyes Blurred vision?: No Double vision?: No  Ears/Nose/Throat Sore throat?: No Sinus problems?: No  Hematologic/Lymphatic Swollen glands?: No Easy bruising?: No  Cardiovascular Leg swelling?: No Chest pain?: No  Respiratory Cough?: No Shortness of breath?: No  Endocrine Excessive thirst?: No  Musculoskeletal Back pain?: Yes Joint pain?: No  Neurological Headaches?: No Dizziness?: No  Psychologic Depression?: No Anxiety?: No  Physical Exam: BP 139/87 (BP Location: Right Arm, Patient Position: Sitting, Cuff Size: Large)   Pulse (!) 104   Ht 6\' 3"  (1.905 m)   Wt 258 lb 4.8 oz (117.2 kg)   BMI 32.29 kg/m   Constitutional:  Alert and oriented, No acute distress. HEENT: [place] AT, moist mucus membranes.  Trachea midline, no masses. Cardiovascular: No clubbing, cyanosis, or edema. CV RRR Respiratory: Normal respiratory effort, no increased work of breathing.  Lungs clear GI: Abdomen is soft, nontender, nondistended, no abdominal masses GU: No CVA tenderness.  Skin: No rashes, bruises or suspicious lesions. Lymph: No cervical or inguinal adenopathy. Neurologic: Grossly intact, no focal deficits, moving all 4 extremities. Psychiatric: Normal mood and affect.  Laboratory Data: Lab Results  Component Value Date   WBC 7.4 01/23/2018   HGB 15.6 01/23/2018   HCT 46.4 01/23/2018   MCV 89.9 01/23/2018   PLT 236 01/23/2018    Lab Results  Component Value Date   CREATININE 1.22 01/23/2018   Urinalysis Lab Results  Component Value Date   APPEARANCEUR HAZY (A) 01/23/2018   LEUKOCYTESUR NEGATIVE 01/23/2018   PROTEINUR NEGATIVE 01/23/2018   GLUCOSEU NEGATIVE 01/23/2018   RBCU TOO NUMEROUS TO COUNT 01/23/2018   BILIRUBINUR NEGATIVE 01/23/2018   NITRITE NEGATIVE 01/23/2018    Lab Results  Component Value Date   BACTERIA NONE SEEN  01/23/2018    Pertinent Imaging: CT reviewed.  Stone density 900 HU  Results for orders placed during the hospital encounter of 01/23/18  CT Renal Stone Study   Narrative CLINICAL DATA:  52 year old male with left lower quadrant pain and nausea. History of renal calculi. Initial encounter.  EXAM: CT ABDOMEN AND PELVIS WITHOUT CONTRAST  TECHNIQUE: Multidetector CT imaging of the abdomen and pelvis was performed following the standard protocol without IV contrast.  COMPARISON:  01/18/2014 CT.  FINDINGS: Lower chest: No worrisome lung base abnormality. Heart size within normal limits.  Hepatobiliary: Enlarged liver spanning over 23.5 cm. Taking into account limitation by non contrast imaging, no worrisome hepatic lesion. No calcified gallstone.  Pancreas: Taking into account limitation by non contrast imaging, no worrisome pancreatic mass or inflammation.  Spleen: Taking into account limitation by non contrast imaging, no worrisome splenic mass top-normal size.  Adrenals/Urinary Tract: Mid left ureteral obstructing calculus with transverse dimension 4 x 5 mm spanning over 8 mm located 14.6 cm proximal to left ureteral junction is causing moderate left-sided  hydro ureteral nephrosis.  Multiple nonobstructing left renal calculi. Three right upper pole nonobstructing renal calculi.  Taking into account limitation by non contrast imaging, no worrisome renal or adrenal lesion.  Partially contracted noncontrast filled views of the urinary bladder without gross abnormality.  Stomach/Bowel: Circumferential narrowing distal esophagus may be related to under distension or reflux. Limited for evaluating for mass.  Few scattered colonic diverticula without extraluminal bowel inflammatory process. No inflammation surrounds the appendix. No obvious small bowel or gastric abnormality.  Vascular/Lymphatic: No aortic aneurysm.  No adenopathy.  Reproductive: Tiny calcifications  within the prostate gland.  Other: No bowel containing hernia or free intraperitoneal air.  Musculoskeletal: Mild curvature lumbar spine convex left with prominent degenerative changes L4-5 and L5-S1. Hemangioma right T12 vertebra.  IMPRESSION: Mid left ureteral obstructing calculus with transverse dimension 4 x 5 mm spanning over 8 mm located 14.6 cm proximal to left ureteral junction is causing moderate left-sided hydroureteronephrosis.  Multiple nonobstructing left renal calculi. Three right upper pole nonobstructing renal calculi.  Enlarged liver spanning over 23.5 cm.  Circumferential narrowing distal esophagus may be related to under distension or reflux. Limited for evaluating for mass.  No extraluminal bowel inflammatory process. Few scattered colonic diverticula.  Degenerative changes L4-5 and L5-S1.   Electronically Signed   By: Genia Del M.D.   On: 01/23/2018 07:47     Assessment & Plan:   52 year old male with renal colic secondary to an obstructing left proximal ureteral calculus.  Based on stone size he was informed it is less likely he will be able to pass the stone spontaneously.  Treatment options were discussed including ureteroscopy with laser lithotripsy and shockwave lithotripsy.  The advantage of ureteroscopy would be an attempt to clear his left kidney. He has elected shockwave lithotripsy and will schedule this week.  The indications and nature of the planned procedure were discussed as well as the potential benefits and expected outcome.  Alternatives were discussed as described above.  The most common complications and side effects were discussed as outlined in the [facility] consent form.  It was stressed that there is no guarantee that lithotripsy will be successful and he could require retreatment or alternative treatment.  The possibility of renal colic from obstructing stone fragments requiring stent placement or ureteroscopy was also  discussed.  He indicated all questions were answered and desires to proceed.  1. Renal colic  - Urinalysis, Complete  2. Ureteral calculus   3. Nephrolithiasis Recommend metabolic evaluation after current episode resolved.    Abbie Sons, [place] [address], [place], [place] [address] [phone]

## 2018-01-25 ENCOUNTER — Telehealth: Payer: Self-pay | Admitting: Radiology

## 2018-01-25 MED ORDER — CIPROFLOXACIN HCL 500 MG PO TABS: 500.0000 mg | ORAL_TABLET | ORAL | Status: DC

## 2018-01-25 NOTE — Telephone Encounter (Signed)
Pt states he passed his kidney stone. Per Dr Bernardo Heater, advised pt to bring stone to office for analysis & get KUB. Also appt made for 1 month f/u with Dr Bernardo Heater. Pt voices understanding with no questions at this time.

## 2018-01-26 ENCOUNTER — Ambulatory Visit
Admission: RE | Admit: 2018-01-26 | Discharge: 2018-01-26 | Disposition: A | Discharge status: Home / Self Care | Payer: 59 | Source: Ambulatory Visit | Attending: Urology | Admitting: Urology

## 2018-01-26 ENCOUNTER — Ambulatory Visit: Admission: RE | Admit: 2018-01-26 | Payer: 59 | Source: Ambulatory Visit | Admitting: Urology

## 2018-01-26 ENCOUNTER — Encounter: Admission: RE | Payer: Self-pay | Source: Ambulatory Visit

## 2018-01-26 DIAGNOSIS — N201 Calculus of ureter: Secondary | ICD-10-CM

## 2018-01-26 DIAGNOSIS — N202 Calculus of kidney with calculus of ureter: Secondary | ICD-10-CM | POA: Diagnosis not present

## 2018-01-26 HISTORY — DX: Calculus of ureter: N20.1

## 2018-01-26 HISTORY — DX: Melanocytic nevi, unspecified: D22.9

## 2018-01-26 SURGERY — LITHOTRIPSY, ESWL
Anesthesia: Moderate Sedation | Laterality: Left

## 2018-01-31 ENCOUNTER — Ambulatory Visit: Payer: Self-pay | Admitting: Urology

## 2018-02-01 ENCOUNTER — Other Ambulatory Visit: Payer: Self-pay | Admitting: Urology

## 2018-02-01 ENCOUNTER — Telehealth: Payer: Self-pay

## 2018-02-01 DIAGNOSIS — N200 Calculus of kidney: Secondary | ICD-10-CM

## 2018-02-01 NOTE — Telephone Encounter (Signed)
Called to inform pt of results, lmom for callback.

## 2018-02-01 NOTE — Telephone Encounter (Signed)
-----   Message from Abbie Sons, MD sent at 02/01/2018  7:47 AM EDT ----- KUB did show possible remaining stone overlying the sacrum.  Recommend a follow-up KUB and renal ultrasound.  Will call with results

## 2018-02-07 DIAGNOSIS — N200 Calculus of kidney: Secondary | ICD-10-CM | POA: Diagnosis not present

## 2018-02-15 ENCOUNTER — Ambulatory Visit: Payer: 59 | Admitting: Urology

## 2018-02-15 ENCOUNTER — Other Ambulatory Visit: Payer: Self-pay | Admitting: Urology

## 2018-02-22 ENCOUNTER — Telehealth: Payer: Self-pay

## 2018-02-22 NOTE — Telephone Encounter (Signed)
-----   Message from Abbie Sons, MD sent at 02/21/2018  2:31 PM EDT ----- Benjamin Wong analysis was excellent.  Recommend follow-up appointment to discuss prevention and I also recommended a follow-up renal ultrasound to document resolution of his hydronephrosis.  Has this been scheduled?

## 2018-02-22 NOTE — Telephone Encounter (Signed)
Spoke with pt. Pt was currently trying to get son out of OR from surgery. Pt will call back.

## 2018-02-27 ENCOUNTER — Ambulatory Visit: Payer: 59 | Admitting: Urology

## 2018-02-28 ENCOUNTER — Ambulatory Visit
Admission: RE | Admit: 2018-02-28 | Discharge: 2018-02-28 | Disposition: A | Discharge status: Home / Self Care | Payer: 59 | Source: Ambulatory Visit | Attending: Urology | Admitting: Urology

## 2018-02-28 DIAGNOSIS — N200 Calculus of kidney: Secondary | ICD-10-CM | POA: Insufficient documentation

## 2018-03-01 ENCOUNTER — Ambulatory Visit: Payer: 59 | Admitting: Urology

## 2018-03-07 ENCOUNTER — Telehealth: Payer: Self-pay | Admitting: Family Medicine

## 2018-03-07 NOTE — Telephone Encounter (Signed)
LMOM to return call.

## 2018-03-07 NOTE — Telephone Encounter (Signed)
Pt called LMOM stating he was returning missed call. Asking for a call back. Thanks.

## 2018-03-07 NOTE — Telephone Encounter (Signed)
-----   Message from Abbie Sons, MD sent at 03/05/2018 10:34 AM EDT ----- His previously noted kidney blockage has resolved.  He does have stones in the kidney.  Would recommend a 24-hour urine study through litho-Link with a follow-up visit.

## 2018-03-13 NOTE — Telephone Encounter (Signed)
Spoke w/ pt, will have litholink sent

## 2019-01-08 IMAGING — CR DG ABDOMEN 1V
1 series · 2 of 2 positions shown · non-contrast
Comparison: CT abdomen pelvis of 01/23/2018

CLINICAL DATA: Left kidney stone, pre lithotripsy

EXAM:
ABDOMEN - 1 VIEW

[Series 1: dg abd 1 view · 0.14mm/px · 2 of 2 slices shown]
[im 1/2]
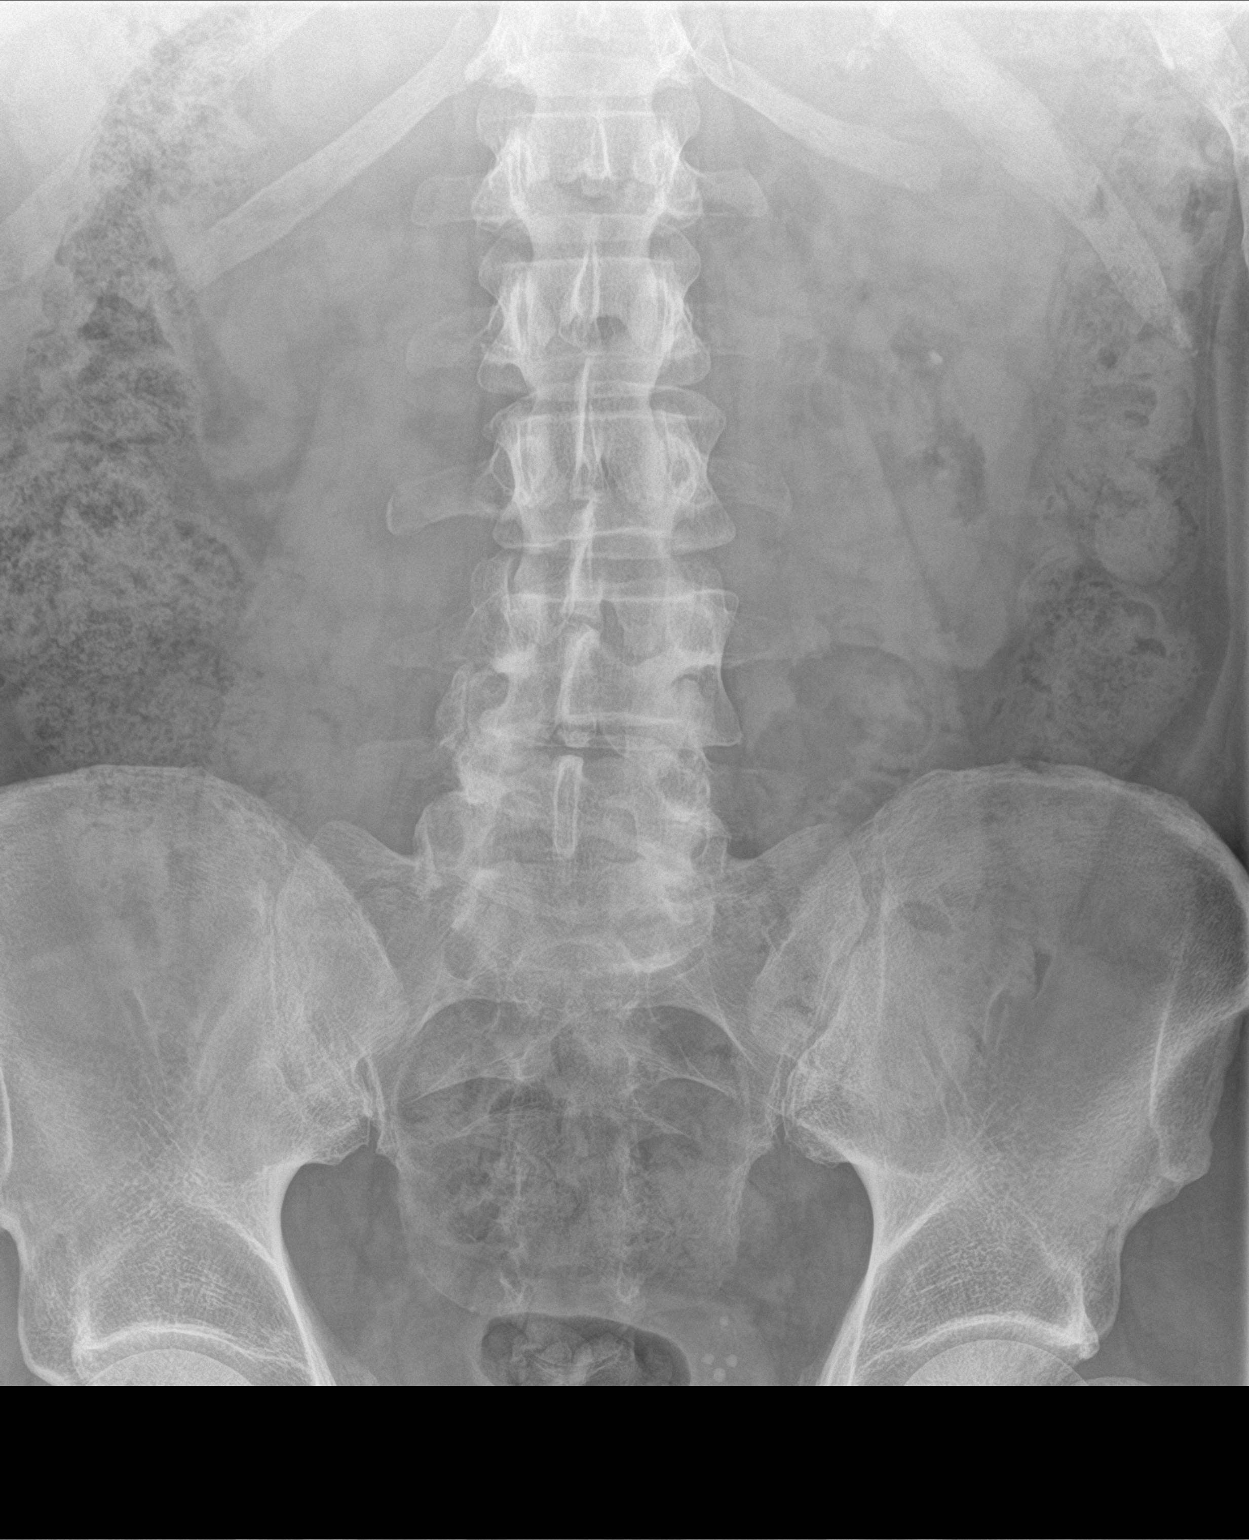
[im 2/2]
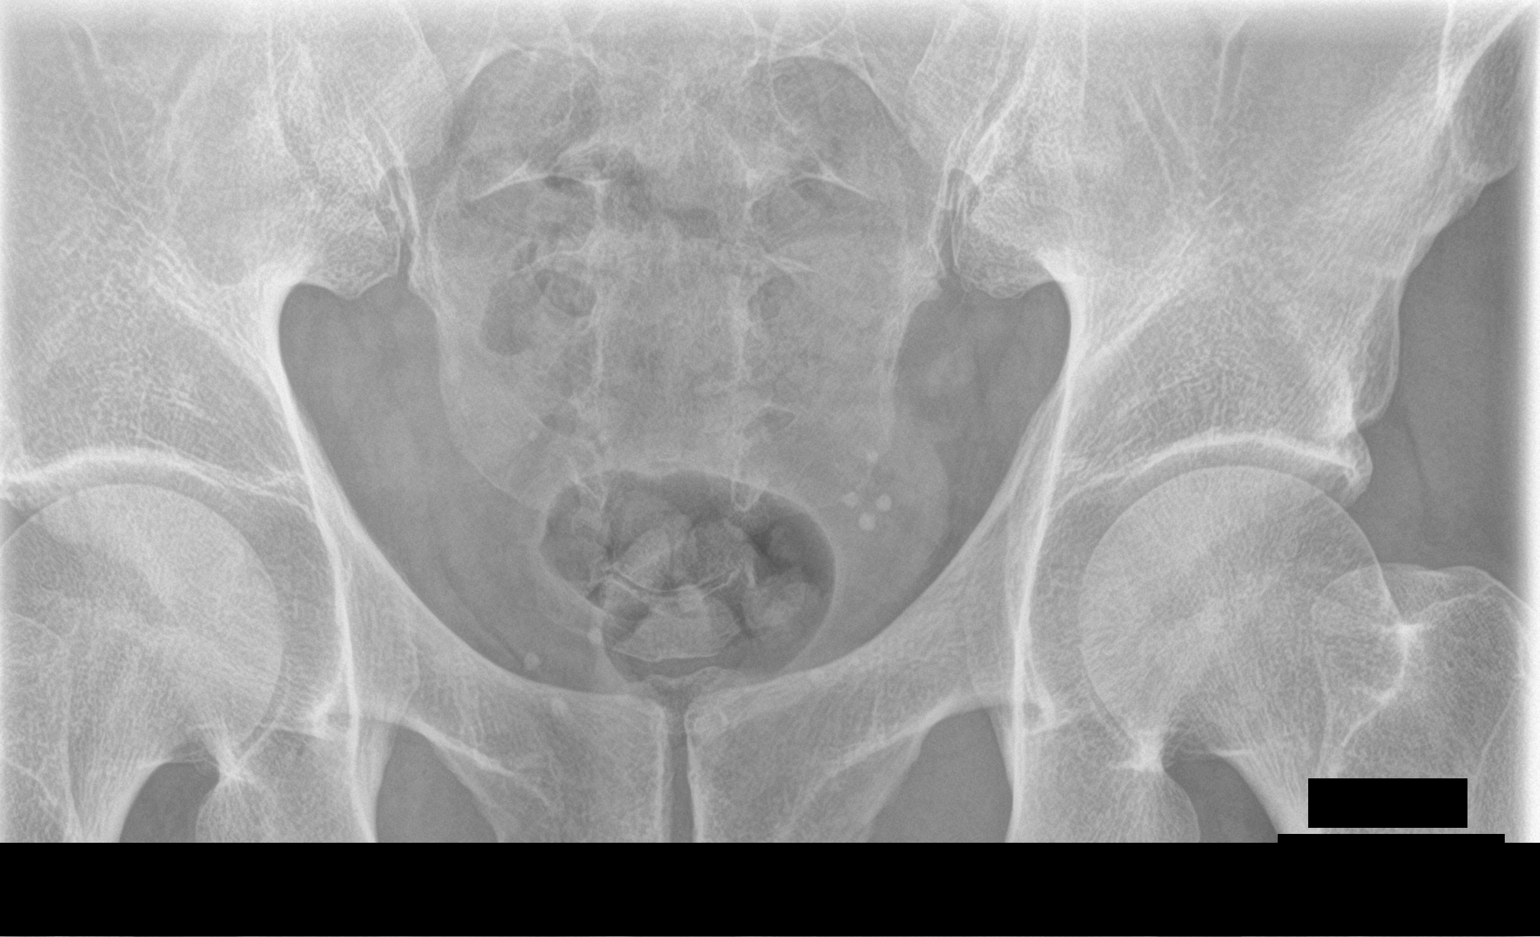

[2 of 2 positions shown; findings below may reference images not displayed]

FINDINGS: A left lower pole renal calculus is noted. No definite right renal
calculus seen although portions of both kidneys are overlain by
bowel gas and feces making assessment difficult. The left mid
ureteral calculus previously noted by CT is not definitely seen on
present KUB. There is a faint calcific density overlying the left
sacrum which could represent a small fragment of that left ureteral
calculus. Multiple phleboliths are again noted in the pelvis. The
bowel gas pattern is nonspecific.
IMPRESSION: 1. The previous left mid distal ureteral calculus is not definitely
seen although there is a faint calcific density overlying the left
sacrum which could represent that calculus.
2. Left ureteral calculus. No other calculi are seen although the
kidneys are somewhat obscured by overlying bowel gas and feces.

## 2022-03-23 DIAGNOSIS — K219 Gastro-esophageal reflux disease without esophagitis: Secondary | ICD-10-CM | POA: Insufficient documentation

## 2022-03-25 ENCOUNTER — Other Ambulatory Visit: Payer: Self-pay | Admitting: Family Medicine

## 2022-03-25 DIAGNOSIS — R1310 Dysphagia, unspecified: Secondary | ICD-10-CM

## 2022-09-02 ENCOUNTER — Ambulatory Visit: Payer: Self-pay | Admitting: Physician Assistant

## 2022-09-02 ENCOUNTER — Encounter: Payer: Self-pay | Admitting: Physician Assistant

## 2022-09-02 ENCOUNTER — Ambulatory Visit: Payer: Self-pay

## 2022-09-02 VITALS — BP 102/59 | HR 91 | Temp 98.4°F | Resp 14 | Ht 75.0 in | Wt 276.0 lb

## 2022-09-02 DIAGNOSIS — Z021 Encounter for pre-employment examination: Secondary | ICD-10-CM

## 2022-09-02 LAB — POCT URINALYSIS DIPSTICK
Bilirubin, UA: NEGATIVE
Glucose, UA: NEGATIVE
Ketones, UA: NEGATIVE
Leukocytes, UA: NEGATIVE
Nitrite, UA: NEGATIVE
Protein, UA: POSITIVE — AB
Spec Grav, UA: >=1.03 — AB (ref 1.010–1.025)
Urobilinogen, UA: 0.2 E.U./dL
pH, UA: 5 (ref 5.0–8.0)

## 2022-09-02 NOTE — Progress Notes (Signed)
Pt presents today for pre-employment physical, UDS, and labs for PO1.

## 2022-09-02 NOTE — Progress Notes (Signed)
Pt presents today to complete Pre-employment PO1 physical. Pt denies any issues or concerns at this time./CL,RMA

## 2022-09-02 NOTE — Progress Notes (Signed)
[employer] [place] occupational health clinic  ____________________________________________   None    (approximate)  I have reviewed the triage vital signs and the nursing notes.   HISTORY  Chief Complaint Employment Physical   HPI Benjamin Wong is a 56 y.o. male patient presents for preemployment for [employer].  Patient is voiced no concerns or complaints.        Past Medical History:  Diagnosis Date   Allergy    GERD (gastroesophageal reflux disease)    Left ureteral stone 01/2018   Numerous skin moles 1990   removed    Patient Active Problem List   Diagnosis Date Noted   Gastroesophageal reflux disease without esophagitis 03/23/2022   Ureteral calculus 01/24/2018   Nephrolithiasis 01/24/2018   Hypertriglyceridemia 03/03/2017   Chronic left-sided low back pain with left-sided sciatica 03/02/2017   Obesity (BMI 35.0-39.9 without comorbidity) 45/80/9983   Renal colic 38/25/0539    Past Surgical History:  Procedure Laterality Date   COLONOSCOPY WITH ESOPHAGOGASTRODUODENOSCOPY (EGD)  09/2017   KNEE ARTHROSCOPY Right 2015   LITHOTRIPSY     PATELLAR TENDON REPAIR Right    WISDOM TOOTH EXTRACTION      Prior to Admission medications   Medication Sig Start Date End Date Taking? Authorizing Provider  cetirizine (ZYRTEC) 10 MG tablet Take 1 tablet by mouth daily.   Yes [provider]    Allergies Prednisone, Morphine and related, and Other  Family History  Problem Relation Age of Onset   Prostate cancer Neg Hx    Bladder Cancer Neg Hx    Kidney cancer Neg Hx     Social History Social History   Tobacco Use   Smoking status: Never   Smokeless tobacco: Former    Types: Snuff  Vaping Use   Vaping Use: Never used  Substance Use Topics   Alcohol use: No   Drug use: No    Review of Systems  Constitutional: No fever/chills Eyes: No visual changes. ENT: No sore throat. Cardiovascular: Denies chest pain. Respiratory: Denies  shortness of breath. Gastrointestinal: No abdominal pain.  No nausea, no vomiting.  No diarrhea.  No constipation. Genitourinary: Negative for dysuria. Musculoskeletal: Negative for back pain. Skin: Negative for rash. Neurological: Negative for headaches, focal weakness or numbness. Endocrine: Hyperlipidemia Allergic/Immunilogical: Prednisone and morphine.   ____________________________________________   PHYSICAL EXAM:  VITAL SIGNS: BP 102/59, pulse 91, respiration 14, temperature 98.4, patient is 98% O2 sat on room air.  Patient weighs 276 pounds and BMI is 34.50. Constitutional: Alert and oriented. Well appearing and in no acute distress. Eyes: Conjunctivae are normal. PERRL. EOMI. Head: Atraumatic. Nose: No congestion/rhinnorhea. Mouth/Throat: Mucous membranes are moist.  Oropharynx non-erythematous. Neck: No stridor.  No cervical spine tenderness to palpation. Hematological/Lymphatic/Immunilogical: No cervical lymphadenopathy. Cardiovascular: Normal rate, regular rhythm. Grossly normal heart sounds.  Good peripheral circulation. Respiratory: Normal respiratory effort.  No retractions. Lungs CTAB. Gastrointestinal: Soft and nontender. No distention. No abdominal bruits. No CVA tenderness. Genitourinary: Deferred Musculoskeletal: No lower extremity tenderness nor edema.  No joint effusions. Neurologic:  Normal speech and language. No gross focal neurologic deficits are appreciated. No gait instability. Skin:  Skin is warm, dry and intact. No rash noted. Psychiatric: Mood and affect are normal. Speech and behavior are normal.  ____________________________________________   LABS       Component Ref Range & Units 14:30 (09/02/22) 4 yr ago (01/24/18) 4 yr ago (01/23/18)  Color, UA  yellow  Yellow R    Clarity, UA  cloudy     Glucose, UA Negative Negative     Bilirubin, UA  neg  Negative R    Ketones, UA  neg  Negative R    Spec Grav, UA 1.010 - 1.025 >=1.030 Abnormal   1.010 R     Blood, UA  trace -+  1+ Abnormal  R    pH, UA 5.0 - 8.0 5.0  5.5 R    Protein, UA Negative Positive Abnormal   Negative R    Comment: trace -+  Urobilinogen, UA 0.2 or 1.0 E.U./dL 0.2     Nitrite, UA  neg  Negative R    Leukocytes, UA Negative Negative  Negative  NEGATIVE R   Appearance    HAZY Abnormal  R           ________________________________  EKG Sinus bradycardia at 51 bpm.  Occasional ectopic ventricular beat  ____________________________________________   ____________________________________________   INITIAL IMPRESSION / ASSESSMENT AND PLAN  As part of my medical decision making, I reviewed the following data within the [place] patient lab results are pending     ____________________________________________   FINAL CLINICAL IMPRESSION Well exam pending lab results   ED Discharge Orders     None        Note:  This document was prepared using Dragon voice recognition software and may include unintentional dictation errors.

## 2022-09-05 LAB — CMP12+LP+TP+TSH+6AC+PSA+CBC…
ALT: 22 IU/L (ref 0–44)
AST: 20 IU/L (ref 0–40)
Albumin/Globulin Ratio: 1.9 (ref 1.2–2.2)
Albumin: 4.5 g/dL (ref 3.8–4.9)
Alkaline Phosphatase: 70 IU/L (ref 44–121)
BUN/Creatinine Ratio: 15 (ref 9–20)
BUN: 18 mg/dL (ref 6–24)
Basophils Absolute: 0.1 10*3/uL (ref 0.0–0.2)
Basos: 1 %
Bilirubin Total: 0.4 mg/dL (ref 0.0–1.2)
Calcium: 9.4 mg/dL (ref 8.7–10.2)
Chloride: 103 mmol/L (ref 96–106)
Chol/HDL Ratio: 6 ratio — ABNORMAL HIGH (ref 0.0–5.0)
Cholesterol, Total: 192 mg/dL (ref 100–199)
Creatinine, Ser: 1.18 mg/dL (ref 0.76–1.27)
EOS (ABSOLUTE): 0.2 10*3/uL (ref 0.0–0.4)
Eos: 3 %
Estimated CHD Risk: 1.3 times avg. — ABNORMAL HIGH (ref 0.0–1.0)
Free Thyroxine Index: 1.1 — ABNORMAL LOW (ref 1.2–4.9)
GGT: 35 IU/L (ref 0–65)
Globulin, Total: 2.4 g/dL (ref 1.5–4.5)
Glucose: 98 mg/dL (ref 70–99)
HDL: 32 mg/dL — ABNORMAL LOW (ref >39)
Hematocrit: 42.9 % (ref 37.5–51.0)
Hemoglobin: 15 g/dL (ref 13.0–17.7)
Immature Grans (Abs): 0 10*3/uL (ref 0.0–0.1)
Immature Granulocytes: 0 %
Iron: 106 ug/dL (ref 38–169)
LDH: 173 IU/L (ref 121–224)
LDL Chol Calc (NIH): 102 mg/dL — ABNORMAL HIGH (ref 0–99)
Lymphocytes Absolute: 2.2 10*3/uL (ref 0.7–3.1)
Lymphs: 32 %
MCH: 31.2 pg (ref 26.6–33.0)
MCHC: 35 g/dL (ref 31.5–35.7)
MCV: 89 fL (ref 79–97)
Monocytes Absolute: 0.5 10*3/uL (ref 0.1–0.9)
Monocytes: 6 %
Neutrophils Absolute: 4.1 10*3/uL (ref 1.4–7.0)
Neutrophils: 58 %
Phosphorus: 3.3 mg/dL (ref 2.8–4.1)
Platelets: 227 10*3/uL (ref 150–450)
Potassium: 4.1 mmol/L (ref 3.5–5.2)
Prostate Specific Ag, Serum: 0.4 ng/mL (ref 0.0–4.0)
RBC: 4.81 x10E6/uL (ref 4.14–5.80)
RDW: 12.2 % (ref 11.6–15.4)
Sodium: 139 mmol/L (ref 134–144)
T3 Uptake Ratio: 27 % (ref 24–39)
T4, Total: 4.1 ug/dL — ABNORMAL LOW (ref 4.5–12.0)
TSH: 3.49 u[IU]/mL (ref 0.450–4.500)
Total Protein: 6.9 g/dL (ref 6.0–8.5)
Triglycerides: 338 mg/dL — ABNORMAL HIGH (ref 0–149)
Uric Acid: 6.1 mg/dL (ref 3.8–8.4)
VLDL Cholesterol Cal: 58 mg/dL — ABNORMAL HIGH (ref 5–40)
WBC: 7 10*3/uL (ref 3.4–10.8)
eGFR: 73 mL/min/{1.73_m2} (ref >59)

## 2022-09-05 LAB — QUANTIFERON-TB GOLD PLUS
QuantiFERON Mitogen Value: >10 IU/mL
QuantiFERON Nil Value: 0.03 IU/mL
QuantiFERON TB1 Ag Value: 0.07 IU/mL
QuantiFERON TB2 Ag Value: 0.05 IU/mL
QuantiFERON-TB Gold Plus: NEGATIVE

## 2022-09-05 LAB — HEPATITIS B SURFACE ANTIBODY,QUALITATIVE: Hep B Surface Ab, Qual: REACTIVE

## 2022-09-10 ENCOUNTER — Telehealth: Payer: Self-pay

## 2022-09-10 NOTE — Telephone Encounter (Signed)
Benjamin Wong reviewed lab results from 09/02/2022 new hire physical.   Triglycerides are elevated (338). Benjamin Wong wanted Benjamin Wong to come in to discuss results. Informed him that Benjamin Wong wasn't fasting for labs because was physical was done in the afternoon & that Dr. Netty Starring of [facility] is listed as his PCP. Benjamin Wong asked me to contact Benjamin Wong about results & to advise him to share information with his PCP. Benjamin Wong does want Benjamin Wong to repeat the UA.  Contacted Benjamin Wong & reviewed triglyceride results & Benjamin Wong's concerns with him.  States he just had a physical a couple of weeks ago with Dr. Netty Starring & the triglycerides were elevated then but they had come down from a higher number & that Dr. Netty Starring chose not to put him on medication at this time.  He also confirmed that he wasn't fasting for the labs we collected on 09/02/22.  Also, advised Benjamin Wong of Benjamin Wong's request for him to return to clinic for repeat UA.  Told him he can just come any time & doesn't have to make an appt.  Verbalized understanding.  AMD

## 2022-10-19 ENCOUNTER — Ambulatory Visit: Payer: 59 | Admitting: Dermatology

## 2022-12-27 ENCOUNTER — Other Ambulatory Visit: Payer: Self-pay | Admitting: Physician Assistant

## 2022-12-27 DIAGNOSIS — Z1152 Encounter for screening for COVID-19: Secondary | ICD-10-CM

## 2022-12-27 DIAGNOSIS — R6889 Other general symptoms and signs: Secondary | ICD-10-CM

## 2022-12-27 DIAGNOSIS — J020 Streptococcal pharyngitis: Secondary | ICD-10-CM

## 2022-12-27 LAB — POCT INFLUENZA A/B
Influenza A, POC: NEGATIVE
Influenza B, POC: NEGATIVE

## 2022-12-27 LAB — POC COVID19 BINAXNOW: SARS Coronavirus 2 Ag: NEGATIVE

## 2022-12-27 LAB — POCT RAPID STREP A (OFFICE): Rapid Strep A Screen: POSITIVE — AB

## 2022-12-27 MED ORDER — PROMETHAZINE-DM 6.25-15 MG/5ML PO SYRP: 5.0000 mL | ORAL_SOLUTION | Freq: Four times a day (QID) | ORAL | 0 refills | Status: DC | PRN

## 2022-12-27 MED ORDER — IBUPROFEN 800 MG PO TABS: 800.0000 mg | ORAL_TABLET | Freq: Three times a day (TID) | ORAL | 0 refills | Status: DC | PRN

## 2022-12-27 MED ORDER — LIDOCAINE VISCOUS HCL 2 % MT SOLN: 5.0000 mL | Freq: Four times a day (QID) | OROMUCOSAL | 0 refills | Status: DC | PRN

## 2022-12-27 MED ORDER — AMOXICILLIN 875 MG PO TABS: 875.0000 mg | ORAL_TABLET | Freq: Two times a day (BID) | ORAL | 0 refills | Status: AC

## 2022-12-27 NOTE — Progress Notes (Signed)
covid/flu, strep test: sore throat, fever, chills since Friday night, 12-24-22  Positive for strep. Negative for flu and covid.

## 2022-12-27 NOTE — Progress Notes (Signed)
   Subjective: Sore throat    Patient ID: Benjamin Wong, male    DOB: 1966/02/13, 57 y.o.   MRN: [id number]  HPI Patient complain of sore throat for few days.  State also had fever and chills.  Taking Tylenol and using salt water gargles.  Denies recent travel or known contact with COVID-19.  Patient tested negative for COVID-19.   Review of Systems GERD and hyperlipidemia    Objective:   Physical Exam  Deferred secondary to telephonic encounter Patient test positive for strep pharyngitis prior to this call.      Assessment & Plan: Strep pharyngitis   Patient given prescription for amoxicillin, Phenergan DM to mix with viscous lidocaine for swish and swallow and a prescription for ibuprofen.  Advised to follow-up in 2 to 3 days if no improvement or worsening complaint.

## 2023-03-29 ENCOUNTER — Other Ambulatory Visit: Payer: Self-pay | Admitting: Family Medicine

## 2023-03-29 DIAGNOSIS — Z9189 Other specified personal risk factors, not elsewhere classified: Secondary | ICD-10-CM

## 2023-03-29 DIAGNOSIS — E781 Pure hyperglyceridemia: Secondary | ICD-10-CM

## 2023-03-29 DIAGNOSIS — Z0000 Encounter for general adult medical examination without abnormal findings: Secondary | ICD-10-CM

## 2023-04-11 ENCOUNTER — Ambulatory Visit
Admission: RE | Admit: 2023-04-11 | Discharge: 2023-04-11 | Disposition: A | Discharge status: Home / Self Care | Payer: 59 | Source: Ambulatory Visit | Attending: Family Medicine | Admitting: Family Medicine

## 2023-04-11 DIAGNOSIS — E781 Pure hyperglyceridemia: Secondary | ICD-10-CM

## 2023-04-11 DIAGNOSIS — Z9189 Other specified personal risk factors, not elsewhere classified: Secondary | ICD-10-CM

## 2023-04-11 DIAGNOSIS — Z0000 Encounter for general adult medical examination without abnormal findings: Secondary | ICD-10-CM | POA: Insufficient documentation

## 2023-11-08 ENCOUNTER — Other Ambulatory Visit: Payer: Self-pay

## 2023-11-08 DIAGNOSIS — R509 Fever, unspecified: Secondary | ICD-10-CM

## 2023-11-08 DIAGNOSIS — R051 Acute cough: Secondary | ICD-10-CM

## 2023-11-08 LAB — POC COVID19 BINAXNOW: SARS Coronavirus 2 Ag: NEGATIVE

## 2023-11-08 LAB — POCT INFLUENZA A/B
Influenza A, POC: NEGATIVE
Influenza B, POC: NEGATIVE

## 2023-11-08 NOTE — Progress Notes (Signed)
Reports several days of lethargy, non productive cough and increasing sinusitis.  Tested for covid and flu outside and results reported.  Discussed OTC meds and reported staying hydrated.  Will call if further visit requested with provider.

## 2024-08-10 ENCOUNTER — Ambulatory Visit: Payer: Self-pay

## 2024-08-10 DIAGNOSIS — Z1211 Encounter for screening for malignant neoplasm of colon: Secondary | ICD-10-CM | POA: Diagnosis present

## 2024-08-10 DIAGNOSIS — Z83719 Family history of colon polyps, unspecified: Secondary | ICD-10-CM | POA: Diagnosis not present

## 2024-11-08 ENCOUNTER — Ambulatory Visit: Payer: Self-pay | Admitting: Physician Assistant

## 2024-11-08 ENCOUNTER — Encounter: Payer: Self-pay | Admitting: Physician Assistant

## 2024-11-08 VITALS — BP 121/75 | HR 76 | Temp 97.3°F | Resp 12 | Ht 75.0 in | Wt 271.0 lb

## 2024-11-08 DIAGNOSIS — Z0184 Encounter for antibody response examination: Secondary | ICD-10-CM

## 2024-11-08 DIAGNOSIS — Z021 Encounter for pre-employment examination: Secondary | ICD-10-CM

## 2024-11-08 NOTE — Progress Notes (Signed)
° °  Subjective: Employee physical    Patient ID: Benjamin Wong, male    DOB: 25-Oct-1966, 58 y.o.   MRN: [id number]  HPI Patient presents for employment physical for the city of [place].  Voices no concerns or complaints.   Review of Systems GERD and hyperlipidemia.    Objective:   Physical Exam BP 121/75  BP Location Left Arm  Patient Position Sitting  Cuff Size Large  Pulse Rate 76  Temp 97.3 F (36.3 C)  Temp Source Temporal  Weight 271 lb (122.9 kg)  Height 6' 3 (1.905 m)  Resp 12  SpO2 94 %   BMI: 33.87 kg/m2  BSA: 2.55 m2  HEENT is unremarkable. Neck is supple for lymphadenopathy or bruits. Lungs are clear to auscultation. Heart regular rate and rhythm. Abdomen with negative HSM, normoactive bowel sounds, soft, nontender to palpation. No obvious deformity to the cervical or lumbar spine.  Patient has full and equal range of motion of the cervical and lumbar spine. No obvious deformity to the upper or lower extremities.  Patient has full and equal range of motion in the upper and lower extremities.  Neurovascular intact. Cranial nerves II through XII are grossly intact.       Assessment & Plan: Employment physical  No acute findings on physical exam.  Cleared for employment.

## 2024-11-08 NOTE — Addendum Note (Signed)
 Addended by: ELUTERIO JENKINS HERO on: 11/08/2024 10:12 AM   Modules accepted: Orders

## 2024-11-10 LAB — QUANTIFERON-TB GOLD PLUS
QuantiFERON Mitogen Value: >10 [IU]/mL
QuantiFERON Nil Value: 0.05 [IU]/mL
QuantiFERON TB1 Ag Value: 0.11 [IU]/mL
QuantiFERON TB2 Ag Value: 0.06 [IU]/mL
QuantiFERON-TB Gold Plus: NEGATIVE

## 2024-11-10 LAB — MEASLES/MUMPS/RUBELLA IMMUNITY
MUMPS ABS, IGG: 19.8 [AU]/ml
RUBEOLA AB, IGG: 23.9 [AU]/ml
Rubella Antibodies, IGG: 2.83 {index}

## 2024-11-10 LAB — HEPATITIS B SURFACE ANTIBODY,QUALITATIVE: Hep B Surface Ab, Qual: NONREACTIVE

## 2024-11-12 NOTE — Progress Notes (Signed)
 Faxed [employer] Exam form to [phone]
